# Patient Record
Sex: Female | Born: 1960 | Race: White | Hispanic: Yes | Marital: Married | Smoking: Never smoker
Health system: Southern US, Community
[De-identification: ages and names within clinical notes are randomized; demographics above are authoritative.]

## PROBLEM LIST (undated history)

## (undated) DIAGNOSIS — E113599 Type 2 diabetes mellitus with proliferative diabetic retinopathy without macular edema, unspecified eye: Secondary | ICD-10-CM

## (undated) DIAGNOSIS — Z992 Dependence on renal dialysis: Secondary | ICD-10-CM

## (undated) DIAGNOSIS — I10 Essential (primary) hypertension: Secondary | ICD-10-CM

## (undated) DIAGNOSIS — E7800 Pure hypercholesterolemia, unspecified: Secondary | ICD-10-CM

## (undated) DIAGNOSIS — E11319 Type 2 diabetes mellitus with unspecified diabetic retinopathy without macular edema: Secondary | ICD-10-CM

## (undated) DIAGNOSIS — E1121 Type 2 diabetes mellitus with diabetic nephropathy: Secondary | ICD-10-CM

## (undated) DIAGNOSIS — J189 Pneumonia, unspecified organism: Secondary | ICD-10-CM

## (undated) DIAGNOSIS — H269 Unspecified cataract: Secondary | ICD-10-CM

## (undated) DIAGNOSIS — G43909 Migraine, unspecified, not intractable, without status migrainosus: Secondary | ICD-10-CM

## (undated) DIAGNOSIS — Z9289 Personal history of other medical treatment: Secondary | ICD-10-CM

## (undated) DIAGNOSIS — N186 End stage renal disease: Secondary | ICD-10-CM

## (undated) DIAGNOSIS — G894 Chronic pain syndrome: Secondary | ICD-10-CM

## (undated) DIAGNOSIS — N838 Other noninflammatory disorders of ovary, fallopian tube and broad ligament: Secondary | ICD-10-CM

## (undated) HISTORY — DX: Type 2 diabetes mellitus with diabetic nephropathy: E11.21

## (undated) HISTORY — DX: Other noninflammatory disorders of ovary, fallopian tube and broad ligament: N83.8

## (undated) HISTORY — DX: Chronic pain syndrome: G89.4

## (undated) HISTORY — DX: Essential (primary) hypertension: I10

## (undated) HISTORY — DX: Type 2 diabetes mellitus with proliferative diabetic retinopathy without macular edema, unspecified eye: E11.3599

## (undated) HISTORY — DX: Type 2 diabetes mellitus with unspecified diabetic retinopathy without macular edema: E11.319

## (undated) HISTORY — DX: Unspecified cataract: H26.9

---

## 1984-12-18 HISTORY — PX: TUBAL LIGATION: SHX77

## 2005-10-03 ENCOUNTER — Emergency Department (HOSPITAL_COMMUNITY): Admission: EM | Admit: 2005-10-03 | Discharge: 2005-10-03 | Payer: Self-pay | Admitting: Emergency Medicine

## 2005-10-09 ENCOUNTER — Ambulatory Visit: Payer: Self-pay | Admitting: Internal Medicine

## 2011-09-18 DIAGNOSIS — Z9289 Personal history of other medical treatment: Secondary | ICD-10-CM

## 2011-09-18 HISTORY — DX: Personal history of other medical treatment: Z92.89

## 2011-10-09 ENCOUNTER — Inpatient Hospital Stay (HOSPITAL_COMMUNITY)
Admission: EM | Admit: 2011-10-09 | Discharge: 2011-10-12 | DRG: 754 | Disposition: A | Discharge status: Home / Self Care | Payer: Self-pay | Attending: Internal Medicine | Admitting: Internal Medicine

## 2011-10-09 ENCOUNTER — Emergency Department (HOSPITAL_COMMUNITY): Payer: Self-pay

## 2011-10-09 DIAGNOSIS — E876 Hypokalemia: Secondary | ICD-10-CM | POA: Diagnosis not present

## 2011-10-09 DIAGNOSIS — F3289 Other specified depressive episodes: Secondary | ICD-10-CM | POA: Diagnosis present

## 2011-10-09 DIAGNOSIS — C569 Malignant neoplasm of unspecified ovary: Principal | ICD-10-CM | POA: Diagnosis present

## 2011-10-09 DIAGNOSIS — D630 Anemia in neoplastic disease: Secondary | ICD-10-CM | POA: Diagnosis present

## 2011-10-09 DIAGNOSIS — F329 Major depressive disorder, single episode, unspecified: Secondary | ICD-10-CM | POA: Diagnosis present

## 2011-10-09 DIAGNOSIS — E119 Type 2 diabetes mellitus without complications: Secondary | ICD-10-CM | POA: Diagnosis present

## 2011-10-09 DIAGNOSIS — N179 Acute kidney failure, unspecified: Secondary | ICD-10-CM | POA: Diagnosis present

## 2011-10-09 DIAGNOSIS — R188 Other ascites: Secondary | ICD-10-CM | POA: Diagnosis present

## 2011-10-09 DIAGNOSIS — E43 Unspecified severe protein-calorie malnutrition: Secondary | ICD-10-CM | POA: Diagnosis present

## 2011-10-09 LAB — DIFFERENTIAL
Basophils Absolute: 0 10*3/uL (ref 0.0–0.1)
Basophils Relative: 0 % (ref 0–1)
Eosinophils Absolute: 0 10*3/uL (ref 0.0–0.7)
Eosinophils Relative: 0 % (ref 0–5)
Lymphocytes Relative: 6 % — ABNORMAL LOW (ref 12–46)
Lymphs Abs: 1.2 10*3/uL (ref 0.7–4.0)
Monocytes Absolute: 1.5 10*3/uL — ABNORMAL HIGH (ref 0.1–1.0)
Monocytes Relative: 7 % (ref 3–12)
Neutro Abs: 18.1 10*3/uL — ABNORMAL HIGH (ref 1.7–7.7)
Neutrophils Relative %: 87 % — ABNORMAL HIGH (ref 43–77)
Smear Review: INCREASED

## 2011-10-09 LAB — COMPREHENSIVE METABOLIC PANEL
ALT: 6 U/L (ref 0–35)
AST: 9 U/L (ref 0–37)
Albumin: 3.1 g/dL — ABNORMAL LOW (ref 3.5–5.2)
Alkaline Phosphatase: 102 U/L (ref 39–117)
BUN: 44 mg/dL — ABNORMAL HIGH (ref 6–23)
CO2: 22 mEq/L (ref 19–32)
Calcium: 9.2 mg/dL (ref 8.4–10.5)
Chloride: 103 mEq/L (ref 96–112)
Creatinine, Ser: 2.99 mg/dL — ABNORMAL HIGH (ref 0.50–1.10)
GFR calc Af Amer: 20 mL/min — ABNORMAL LOW (ref >90)
GFR calc non Af Amer: 17 mL/min — ABNORMAL LOW (ref >90)
Glucose, Bld: 214 mg/dL — ABNORMAL HIGH (ref 70–99)
Potassium: 4.3 mEq/L (ref 3.5–5.1)
Sodium: 139 mEq/L (ref 135–145)
Total Bilirubin: 0.5 mg/dL (ref 0.3–1.2)
Total Protein: 7.6 g/dL (ref 6.0–8.3)

## 2011-10-09 LAB — CBC
HCT: 17.4 % — ABNORMAL LOW (ref 36.0–46.0)
Hemoglobin: 4.5 g/dL — CL (ref 12.0–15.0)
MCH: 14.8 pg — ABNORMAL LOW (ref 26.0–34.0)
MCHC: 25.9 g/dL — ABNORMAL LOW (ref 30.0–36.0)
MCV: 57.2 fL — ABNORMAL LOW (ref 78.0–100.0)
Platelets: 462 10*3/uL — ABNORMAL HIGH (ref 150–400)
RBC: 3.04 MIL/uL — ABNORMAL LOW (ref 3.87–5.11)
RDW: 19.1 % — ABNORMAL HIGH (ref 11.5–15.5)
WBC: 20.8 10*3/uL — ABNORMAL HIGH (ref 4.0–10.5)

## 2011-10-09 LAB — PROTIME-INR
INR: 1.26 (ref 0.00–1.49)
Prothrombin Time: 16.1 seconds — ABNORMAL HIGH (ref 11.6–15.2)

## 2011-10-09 LAB — SODIUM, URINE, RANDOM: Sodium, Ur: 28 mEq/L

## 2011-10-09 LAB — AMMONIA: Ammonia: <10 umol/L — ABNORMAL LOW (ref 11–60)

## 2011-10-09 LAB — TSH: TSH: 9.046 u[IU]/mL — ABNORMAL HIGH (ref 0.350–4.500)

## 2011-10-09 LAB — ABO/RH: ABO/RH(D): O POS

## 2011-10-10 ENCOUNTER — Other Ambulatory Visit: Payer: Self-pay | Admitting: Diagnostic Radiology

## 2011-10-10 ENCOUNTER — Inpatient Hospital Stay (HOSPITAL_COMMUNITY): Payer: Self-pay

## 2011-10-10 LAB — GLUCOSE, CAPILLARY
Glucose-Capillary: 144 mg/dL — ABNORMAL HIGH (ref 70–99)
Glucose-Capillary: 159 mg/dL — ABNORMAL HIGH (ref 70–99)

## 2011-10-10 LAB — URINALYSIS, MICROSCOPIC ONLY
Bilirubin Urine: NEGATIVE
Nitrite: POSITIVE — AB
Specific Gravity, Urine: 1.016 (ref 1.005–1.030)
pH: 5.5 (ref 5.0–8.0)

## 2011-10-10 LAB — IRON AND TIBC
Iron: 28 ug/dL — ABNORMAL LOW (ref 42–135)
Saturation Ratios: 10 % — ABNORMAL LOW (ref 20–55)
TIBC: 286 ug/dL (ref 250–470)
UIBC: 258 ug/dL (ref 125–400)

## 2011-10-10 LAB — AMYLASE, BODY FLUID: Amylase, Fluid: 146 U/L

## 2011-10-10 LAB — VITAMIN B12: Vitamin B-12: 538 pg/mL (ref 211–911)

## 2011-10-10 LAB — COMPREHENSIVE METABOLIC PANEL
AST: 13 U/L (ref 0–37)
BUN: 47 mg/dL — ABNORMAL HIGH (ref 6–23)
CO2: 20 mEq/L (ref 19–32)
Calcium: 9.1 mg/dL (ref 8.4–10.5)
Chloride: 102 mEq/L (ref 96–112)
Creatinine, Ser: 2.62 mg/dL — ABNORMAL HIGH (ref 0.50–1.10)
GFR calc Af Amer: 24 mL/min — ABNORMAL LOW (ref >90)
GFR calc non Af Amer: 20 mL/min — ABNORMAL LOW (ref >90)
Glucose, Bld: 152 mg/dL — ABNORMAL HIGH (ref 70–99)
Total Bilirubin: 0.7 mg/dL (ref 0.3–1.2)

## 2011-10-10 LAB — CBC
MCHC: 30.8 g/dL (ref 30.0–36.0)
RDW: 27.1 % — ABNORMAL HIGH (ref 11.5–15.5)

## 2011-10-10 LAB — LACTATE DEHYDROGENASE, PLEURAL OR PERITONEAL FLUID: LD, Fluid: 1272 U/L — ABNORMAL HIGH (ref 3–23)

## 2011-10-10 LAB — BODY FLUID CELL COUNT WITH DIFFERENTIAL
Monocyte-Macrophage-Serous Fluid: 16 % — ABNORMAL LOW (ref 50–90)
Total Nucleated Cell Count, Fluid: 1000 cu mm (ref 0–1000)

## 2011-10-10 LAB — GLUCOSE, SEROUS FLUID: Glucose, Fluid: 139 mg/dL

## 2011-10-10 LAB — CA 125: CA 125: 5863.7 U/mL — ABNORMAL HIGH (ref 0.0–30.2)

## 2011-10-11 LAB — COMPREHENSIVE METABOLIC PANEL
ALT: 7 U/L (ref 0–35)
AST: 11 U/L (ref 0–37)
Albumin: 2.5 g/dL — ABNORMAL LOW (ref 3.5–5.2)
CO2: 24 mEq/L (ref 19–32)
Chloride: 107 mEq/L (ref 96–112)
GFR calc non Af Amer: 29 mL/min — ABNORMAL LOW (ref >90)
Sodium: 144 mEq/L (ref 135–145)
Total Bilirubin: 0.7 mg/dL (ref 0.3–1.2)

## 2011-10-11 LAB — CROSSMATCH
ABO/RH(D): O POS
Antibody Screen: NEGATIVE
Unit division: 0
Unit division: 0
Unit division: 0
Unit division: 0

## 2011-10-11 LAB — CBC
Platelets: 360 10*3/uL (ref 150–400)
RBC: 5.05 MIL/uL (ref 3.87–5.11)
RDW: 26.2 % — ABNORMAL HIGH (ref 11.5–15.5)
WBC: 9.8 10*3/uL (ref 4.0–10.5)

## 2011-10-11 LAB — URINE CULTURE
Colony Count: >=100000
Culture  Setup Time: 201210230612

## 2011-10-11 LAB — GLUCOSE, CAPILLARY
Glucose-Capillary: 95 mg/dL (ref 70–99)
Glucose-Capillary: 100 mg/dL — ABNORMAL HIGH (ref 70–99)

## 2011-10-11 LAB — FOLATE RBC: RBC Folate: 949 ng/mL — ABNORMAL HIGH (ref >=366)

## 2011-10-11 LAB — MAGNESIUM: Magnesium: 2.3 mg/dL (ref 1.5–2.5)

## 2011-10-12 ENCOUNTER — Other Ambulatory Visit (HOSPITAL_COMMUNITY)
Admission: RE | Admit: 2011-10-12 | Discharge: 2011-10-12 | Disposition: A | Discharge status: Home / Self Care | Payer: Self-pay | Source: Ambulatory Visit | Attending: Gynecologic Oncology | Admitting: Gynecologic Oncology

## 2011-10-12 ENCOUNTER — Ambulatory Visit: Payer: Self-pay | Attending: Gynecologic Oncology | Admitting: Gynecologic Oncology

## 2011-10-12 ENCOUNTER — Other Ambulatory Visit: Payer: Self-pay | Admitting: Gynecologic Oncology

## 2011-10-12 DIAGNOSIS — R1909 Other intra-abdominal and pelvic swelling, mass and lump: Secondary | ICD-10-CM | POA: Insufficient documentation

## 2011-10-12 DIAGNOSIS — Z8540 Personal history of malignant neoplasm of unspecified female genital organ: Secondary | ICD-10-CM | POA: Insufficient documentation

## 2011-10-12 DIAGNOSIS — N8500 Endometrial hyperplasia, unspecified: Secondary | ICD-10-CM | POA: Insufficient documentation

## 2011-10-12 DIAGNOSIS — E119 Type 2 diabetes mellitus without complications: Secondary | ICD-10-CM | POA: Insufficient documentation

## 2011-10-12 DIAGNOSIS — R971 Elevated cancer antigen 125 [CA 125]: Secondary | ICD-10-CM | POA: Insufficient documentation

## 2011-10-12 DIAGNOSIS — R188 Other ascites: Secondary | ICD-10-CM | POA: Insufficient documentation

## 2011-10-12 LAB — BASIC METABOLIC PANEL
BUN: 46 mg/dL — ABNORMAL HIGH (ref 6–23)
CO2: 26 mEq/L (ref 19–32)
Chloride: 108 mEq/L (ref 96–112)
Creatinine, Ser: 1.69 mg/dL — ABNORMAL HIGH (ref 0.50–1.10)
GFR calc Af Amer: 40 mL/min — ABNORMAL LOW (ref >90)
Glucose, Bld: 96 mg/dL (ref 70–99)
Potassium: 3.5 mEq/L (ref 3.5–5.1)

## 2011-10-12 LAB — CBC
HCT: 36.7 % (ref 36.0–46.0)
Hemoglobin: 11.7 g/dL — ABNORMAL LOW (ref 12.0–15.0)
MCV: 69.9 fL — ABNORMAL LOW (ref 78.0–100.0)
RBC: 5.25 MIL/uL — ABNORMAL HIGH (ref 3.87–5.11)
WBC: 8.4 10*3/uL (ref 4.0–10.5)

## 2011-10-12 LAB — GLUCOSE, CAPILLARY: Glucose-Capillary: 100 mg/dL — ABNORMAL HIGH (ref 70–99)

## 2011-10-13 ENCOUNTER — Other Ambulatory Visit: Payer: Self-pay | Admitting: Gynecology

## 2011-10-13 ENCOUNTER — Encounter (HOSPITAL_COMMUNITY): Payer: Self-pay | Attending: Obstetrics and Gynecology

## 2011-10-13 DIAGNOSIS — Z01818 Encounter for other preprocedural examination: Secondary | ICD-10-CM | POA: Insufficient documentation

## 2011-10-13 DIAGNOSIS — Z01812 Encounter for preprocedural laboratory examination: Secondary | ICD-10-CM | POA: Insufficient documentation

## 2011-10-13 LAB — COMPREHENSIVE METABOLIC PANEL
ALT: 8 U/L (ref 0–35)
Albumin: 2.9 g/dL — ABNORMAL LOW (ref 3.5–5.2)
Alkaline Phosphatase: 169 U/L — ABNORMAL HIGH (ref 39–117)
Chloride: 102 mEq/L (ref 96–112)
Glucose, Bld: 128 mg/dL — ABNORMAL HIGH (ref 70–99)
Potassium: 3.7 mEq/L (ref 3.5–5.1)
Sodium: 142 mEq/L (ref 135–145)
Total Bilirubin: 0.6 mg/dL (ref 0.3–1.2)
Total Protein: 7.8 g/dL (ref 6.0–8.3)

## 2011-10-13 LAB — DIFFERENTIAL
Basophils Relative: 0 % (ref 0–1)
Eosinophils Absolute: 0.5 10*3/uL (ref 0.0–0.7)
Eosinophils Relative: 5 % (ref 0–5)
Lymphs Abs: 1.3 10*3/uL (ref 0.7–4.0)
Monocytes Absolute: 0.7 10*3/uL (ref 0.1–1.0)

## 2011-10-13 LAB — CBC
HCT: 36.6 % (ref 36.0–46.0)
MCH: 22.1 pg — ABNORMAL LOW (ref 26.0–34.0)
MCHC: 30.6 g/dL (ref 30.0–36.0)
MCV: 72.3 fL — ABNORMAL LOW (ref 78.0–100.0)

## 2011-10-13 LAB — BODY FLUID CULTURE

## 2011-10-13 LAB — SURGICAL PCR SCREEN: MRSA, PCR: NEGATIVE

## 2011-10-13 NOTE — Consult Note (Signed)
[name], [name]           ACCOUNT NO.:  [id number]  MEDICAL RECORD NO.:  [id number]  LOCATION:  GYN                          FACILITY:  [facility]  PHYSICIAN:  Laurette Schimke, MD     DATE OF BIRTH:  [birth year]  DATE OF CONSULTATION:10/12/2011 DATE OF DISCHARGE:                                CONSULTATION   REASON FOR CONSULT:  Consult was requested on October 12, 2011, for assistance in evaluation and management of large volume ascites and a pelvic mass.  HISTORY OF PRESENT ILLNESS:  This is a 50 year old, gravida 4, para 4 last normal menstrual period 3 weeks ago.  The patient's history is notable for irregular menses.  She states that they are spaced out.  She reports heavy bleeding for the 1st 2 days of menses.  She states that her periods have been regular since her last pregnancy in 1986.  She presented to the emergency room on October 09, 2011, with complaints of swelling and tenderness of the abdomen for the last 6 months.  On initial evaluation in the emergency room, she was noted to have a hemoglobin of 4.5.  During the hospitalization, she received 4 units of packed red blood cells.  A CT scan of the abdomen and pelvis was collected and was notable for a 27.5-cm cystic mass in the lower abdomen/pelvis with enhancing septation and several calcifications. There is a large amount of associated ascites and anasarca.  There is also small pericardial effusion.  There is no evidence of retroperitoneal or pelvic adenopathy.  Chest x-ray was notable for suboptimal inspiration.  A CA-125 was obtained, returned a value of 5863.  The patient denies any history of alcohol use or abuse.  She denies tuberculosis or TB exposure.  She states that her appetite is normal.  She reports 30-pound weight gain over the last few months.  She denies any rectal bleeding.  She does report constipation and denies diarrhea.  There is no nausea or vomiting.  PAST MEDICAL HISTORY:  Type 2  diabetes mellitus diagnosed 26 years ago, treated with diet.  PAST SURGICAL HISTORY:  Bilateral tubal ligation in 1986.  PAST GYNECOLOGIC HISTORY:  Gravida 4, para 4, 4 normal spontaneous vaginal deliveries.  Menarche occurred at age of 89 with irregular menses.  She denies any history of abnormal Pap test. Her Last Pap test was in 1986.  MEDICATIONS: 1. Phenergan p.r.n. nausea. 2. Tylenol p.r.n. pain.  ALLERGIES:  No known drug allergies.  SOCIAL HISTORY:  The patient is married.  She is from [place] and immigrated to [place] in 1999.  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  She lives with her husband and 1 daughter who is married with children.  REVIEW OF SYSTEMS:  Fatigue, 30-pound weight gain, shortness of breath with exertion, 3-pillow orthopnea.  Denies nocturia.  There is no chest pain.  She reports intermittent edema bilateral lower extremities.  She reports abdominal distention.  Denies vomiting.  Denies bloody stools. No history of colonoscopy.  No diarrhea or intermittent constipation. Denies dysuria or vaginal discharge.  States that her menses are irregular, her menses last approximately 3 days, but  can be heavy. Otherwise, 10-point review of systems is negative.  PHYSICAL EXAMINATION:  GENERAL:  Well-developed very thin cachectic- appearing female in no acute distress. VITAL SIGNS: Weight 147 pounds, height 4 feet 11 inches, blood pressure 134/70, pulse 86. CHEST:  Clear to auscultation. HEART:  Decreased breath sounds over the lower 1/3 of bilateral lung fields. ABDOMEN:  Markedly distended consistent with ascites.  No palpable omental cake. BACK:  No CVA tenderness. PELVIC EXAMINATION:  Normal external genitalia, Bartholin's, urethra, and Skene's.  Cervix is approximately 2 cm without any lesions.  Pap test was collected and endometrial biopsy was obtained.  Uterus sounded to 7 cm.  The pelvic mass was  smooth, no nodularity noted in the cul-de- sac. RECTAL EXAMINATION:  Good anal sphincter tone without any cul-de-sac, nodularity.  LABORATORY DATA:  Paracentesis negative.  Hemoglobin 11.7, hematocrit 36.  BUN and creatinine on October 11, 2011, 1.94, albumin 2.5, hemoglobin A1c 6.7, CA-125 5863.  IMPRESSION:  This is a 50 year old with a large pelvic mass, large volume ascites, and a markedly elevated CA-125.  CT scan is notable for the presence of a septated mass; however, no retroperitoneal adenopathy is noted.  No peritoneal carcinomatosis is appreciated.  Large volume ascites with pleural effusions is noted.  The paracentesis was negative for any evidence of malignancy.  The patient denies history of rectal bleeding or from prior colonoscopy differential diagnosis includes ovarian neoplasm malignancy.  This may be reflective of Meige syndrome or metastatic disease.  An endometrial biopsy was collected today, but it is unclear that the significant anemia was caused by abnormal uterine bleeding.  The recommendation was for exploratory laparotomy with removal of the mass with possible hysterectomy, bilateral salpingo- oophorectomy, debulking or other staging procedures as indicated.  The patient is aware that her poor nutritional status may result in a longer than expected postoperative course.  Through the use of an interpreter, she understands the risks of the procedure, infection, bleeding, damage to surrounding structures, prolonged hospitalization, and reoperation. This procedure will occur on October 17, 2011.  The patient is to have this done by Dr. De Blanch, who will be her surgeon.  All of her questions and those of her husband were answered through an interpreter to their satisfaction.     Laurette Schimke, MD     WB/MEDQ  D:  10/12/2011  T:  10/12/2011  Job:  [id number]  cc:   Philip Aspen, MD       Fax [phone]  Telford Nab, R.N. [address] [place], [place] [address]  Electronically Signed by Laurette Schimke MD on 10/13/2011 10:26:23 AM

## 2011-10-15 LAB — CULTURE, BLOOD (ROUTINE X 2)
Culture  Setup Time: 201210222301
Culture  Setup Time: 201210222301
Culture: NO GROWTH
Culture: NO GROWTH

## 2011-10-15 NOTE — H&P (Signed)
NAMESHONNIE, POUDRIER NO.:  [id number]  MEDICAL RECORD NO.:  [id number]  LOCATION:  [facility]                         FACILITY:  [facility]  PHYSICIAN:  Jonny Ruiz, MD    DATE OF BIRTH:  Jul 21, 1961  DATE OF ADMISSION:  10/09/2011 DATE OF DISCHARGE:                             HISTORY & PHYSICAL   PRIMARY CARE PHYSICIAN:  Unassigned.  CHIEF COMPLAINT:  Shortness of breath.  HISTORY OF PRESENT ILLNESS:  The patient is a 50 year old female, G80 P4, from [place] living in the [place] since 1999, with no prior medical care except for her last vaginal delivery, presenting to the emergency department because of shortness of breath.  The patient has been developing swelling and tenderness of the abdomen for the last 6 months.  Upon further questioning, she says that she has been having intermittent mild-to-moderate generalized abdominal pain for the last 4- 5 years after she underwent a knee surgery for an accident about 4-5 years ago.  Over the last 6 months, she has noticed that her abdomen has been getting bigger to the point that she was not able to lie flat or speak in full sentences.  Today the patient was evaluated in the ED with a CT scan of the abdomen in addition to her blood work, and has been found with a large ovarian mass associated to ascites/anasarca, and a hemoglobin of 4.5.  Hospitalist was contacted for further evaluation and management.  The patient denies a history of alcohol abuse, drug abuse, or hepatitis.  She denies tuberculosis or TB exposure.  She tells me her last daughter developed hepatitis at age 74.  The patient has never had a Pap smear or a mammogram.  She has never had a colonoscopy.  She is not sure if she received BCG vaccination in her country.  The patient is always able to recall that she was told she had diabetes, but she did not need medications.  PAST MEDICAL HISTORY:  Diabetes mellitus type 2 diagnosed 26 years ago and  told that she only needed to be on a diet.  PAST SURGICAL HISTORY:  Bilateral tubal ligation 1986.  G4 P4.  MEDICATIONS:  None, but she does take aspirin and Aleve on a p.r.n. basis for abdominal pain.  ALLERGIES:  NO KNOWN DRUG ALLERGIES.  FAMILY HISTORY:  Mother died in her 73s from a heart attack.  Father, she never knew.  Her brothers and sisters, she is not aware of their health conditions.  Grandparents, not aware of health conditions.  Again 1 daughter developed hepatitis of an unknown type at the age of 2.  SOCIAL HISTORY:  The patient is originally from [address], [place], and came to the [place] in 1999.  She used to work in [employer] of [employer], but not anymore.  Currently she lives with her husband, and she has 1 daughter who is married with children who lives with them. She never smoked, drank, or used drugs.  REVIEW OF SYSTEMS:  CONSTITUTIONAL:  Positive for malaise, fatigue, and weight gain.  Also positive for chills without fever.  No night sweats. CARDIOVASCULAR:  Positive for shortness of breath, orthopnea, and nocturia.  No palpitations  or chest pain.  Also positive for edema of the lower extremities bilaterally.  RESPIRATORY:  Positive for shortness of breath with minimal exertion or in the recumbent position.  No cough or wheezes.  Denies tuberculosis or TB exposure, but she is not sure what tuberculosis is.  GASTROINTESTINAL:  Positive for abdominal pain, generalized, moderate, radiating to her back, lately not relieved by aspirin or Aleve.  Not relieved by eating.  Denies nausea or vomiting. Notices dark stools.  Denies blood.  No diarrhea or constipation.  GU: Denies dysuria, frequency, or hematuria.  No vaginal discharge. Positive for irregular menstrual cycles for the last 4 years associated to heavy menstrual bleeding, the last time 4 days ago.  MUSCULOSKELETAL: No arthralgias or myalgias.  NEUROLOGIC:  No headaches, neck  stiffness, focal weakness, numbness, or paresthesias.  PHYSICAL EXAMINATION:  VITAL SIGNS:  Blood pressure 152/75, pulse 103, respirations 24, temperature 97.8, and O2 saturation 100% on oxygen 2 L per nasal cannula. GENERAL APPEARANCE:  The patient is a chronically ill-looking female who appears pale in no acute distress. HEENT:  Significant for pallor of the skin and mucosa.  Oral cavity, several missing teeth.  Tongue midline oropharynx clear. NECK:  Supple.  No lymphadenopathy.  There is 1+ JVD. HEART:  There is a systolic ejection murmur 2/6 over the precordium, more pronounced over the apex.  There is an S3 gallop.  No rubs. LUNGS:  Diminished breath sounds at the bases with bilateral dry rales. ABDOMEN:  Quite enlarged with the appearance of 9 months pregnancy.  She has vein redistribution as well as tense ascites.  It is tender diffusely.  No guarding or rebounding.  Unable to palpate organomegaly or masses. EXTREMITIES:  She has 3+ pitting edema bilaterally. NEUROLOGIC:  Nonfocal.  LABORATORY DATA:  WBC 20.8, hemoglobin 4.5, hematocrit 17.4, MCV 57.2, platelet count 462, and neutrophils 87%.  She has RBC erythrocytes. Ammonia less than 10.  Sodium 139, potassium 4.3, chloride 103, carbon dioxide 22, glucose 214, BUN 44, creatinine 2.99, calcium 9.2, total protein 7.6, albumin 3.1, AST 9, ALT 6, alk phos 102, and total bili 0.5.  GFR estimated 17.  PT 16.1, normal.  Chest x-ray is markedly suboptimal inspiration due to body habitus accounts for atelectasis in the lower lobes.  No acute cardiopulmonary disease.  Cardiomegaly.  CT abdomen and pelvis without contrast reveals a cystic mass in the lower abdomen and pelvis with enhancing septations and several calcifications. This measures at least 27.5 cm and is likely an ovarian mass.  There is a large amount of associated ascites and anasarca.  There is also a small pericardial effusion.  MEDICAL DECISION MAKING:  This is a  50 year old female originally from [place] with no prior medical care, with a history of diabetes on no treatment, presenting with shortness of breath and found with, 1. Ascites/anasarca. 2. Anemia with a hemoglobin of 4.5. 3. Leukocytosis with a total white count of 20.8, and of a neutrophil     predominance. 4. Renal insufficiency with an estimated creatinine clearance of 17. 5. A large ovarian mass. 6. Systolic hypertension. 7. History of diabetes mellitus type 2. 8. Small pericardial effusion. 9. Irregular menstrual cycle with menorrhagia.  PLAN: 1. Admit the patient to the hospital.  Respiratory isolation, rule out     tuberculosis. 2. Type and cross, and transfuse 4 units of packed RBCs; 2 today and 2     tomorrow. 3. Consult Interventional Radiology for abdominal paracenteses, and     once  it is performed, obtain ascitic fluid analysis.  Again, rule     out tuberculosis, and rule out malignant.  I suspect this is a case     of malignancy. 4. Obtain blood cultures x2. 5. Obtain renal insufficiency workup including UA, urine sodium, urine     creatinine, and make appropriate calculations. 6. Obtain hemoglobin A1c and TSH. 7. Place the patient on insulin at sliding scale. 8. If the blood pressure becomes a problem, we will start the patient     on hydralazine. 9. Obtain GYN consultation. 10.Interferon TB Gold Test. 11.Repeat CBC and B-met in the morning. 12.Obtain 2D echocardiogram to better assess her pericardial effusion. 13.Obtain iron, IBC, ferritin, B12, and RBC folate.          ______________________________ Jonny Ruiz, MD     GL/MEDQ  D:  10/09/2011  T:  10/09/2011  Job:  [id number]  Electronically Signed by Jonny Ruiz MD on 10/15/2011 09:59:51 PM

## 2011-10-17 ENCOUNTER — Inpatient Hospital Stay (HOSPITAL_COMMUNITY)
Admission: RE | Admit: 2011-10-17 | Discharge: 2011-10-20 | DRG: 742 | Disposition: A | Discharge status: Home / Self Care | Payer: Self-pay | Source: Ambulatory Visit | Attending: Obstetrics & Gynecology | Admitting: Obstetrics & Gynecology

## 2011-10-17 ENCOUNTER — Other Ambulatory Visit: Payer: Self-pay | Admitting: Gynecology

## 2011-10-17 DIAGNOSIS — N838 Other noninflammatory disorders of ovary, fallopian tube and broad ligament: Secondary | ICD-10-CM

## 2011-10-17 DIAGNOSIS — N80109 Endometriosis of ovary, unspecified side, unspecified depth: Principal | ICD-10-CM | POA: Diagnosis present

## 2011-10-17 DIAGNOSIS — E119 Type 2 diabetes mellitus without complications: Secondary | ICD-10-CM | POA: Diagnosis present

## 2011-10-17 DIAGNOSIS — N801 Endometriosis of ovary: Principal | ICD-10-CM | POA: Diagnosis present

## 2011-10-17 DIAGNOSIS — Z01812 Encounter for preprocedural laboratory examination: Secondary | ICD-10-CM

## 2011-10-17 DIAGNOSIS — R188 Other ascites: Secondary | ICD-10-CM | POA: Diagnosis present

## 2011-10-17 DIAGNOSIS — D649 Anemia, unspecified: Secondary | ICD-10-CM | POA: Diagnosis present

## 2011-10-17 DIAGNOSIS — Z01810 Encounter for preprocedural cardiovascular examination: Secondary | ICD-10-CM

## 2011-10-17 DIAGNOSIS — R971 Elevated cancer antigen 125 [CA 125]: Secondary | ICD-10-CM | POA: Diagnosis present

## 2011-10-17 HISTORY — DX: Other noninflammatory disorders of ovary, fallopian tube and broad ligament: N83.8

## 2011-10-17 HISTORY — PX: EXPLORATORY LAPAROTOMY: SUR591

## 2011-10-17 HISTORY — PX: ABDOMINAL HYSTERECTOMY: SHX81

## 2011-10-17 LAB — GLUCOSE, CAPILLARY: Glucose-Capillary: 134 mg/dL — ABNORMAL HIGH (ref 70–99)

## 2011-10-17 LAB — TYPE AND SCREEN
ABO/RH(D): O POS
Antibody Screen: NEGATIVE

## 2011-10-17 LAB — ABO/RH: ABO/RH(D): O POS

## 2011-10-17 NOTE — Discharge Summary (Signed)
[name], [name]           ACCOUNT NO.:  [id number]  MEDICAL RECORD NO.:  [id number]  LOCATION:  4507                         FACILITY:  [facility]  PHYSICIAN:  Peggye Pitt, M.D. DATE OF BIRTH:  13-Aug-1961  DATE OF ADMISSION:  10/09/2011 DATE OF DISCHARGE:  10/12/2011                              DISCHARGE SUMMARY   DISCHARGE DIAGNOSES: 1. 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 2. Acute renal failure, improving. 3. Anemia of chronic disease. 4. Hypokalemia, repleted.  DISCHARGE MEDICATIONS: 1. Phenergan 25 mg every 8 hours as needed for nausea. 2. Tylenol 650 mg every 4 hours as needed for pain or fever.  DISPOSITION AND FOLLOWUP:  [name] will be discharged today in stable condition.  She has an appointment today at 1:00 p.m. at the [facility] with Dr. Laurette Schimke who is the GYN oncologist.  She will likely need further staging and treatment for this problem.  CONSULTATIONS THIS HOSPITALIZATION:  None.IMAGES AND PROCEDURES: 1. A chest x-ray on October 22 that showed markedly suboptimal     inspiration with no acute cardiopulmonary disease and cardiomegaly. 2. A CT scan of the abdomen and pelvis without contrast on October 22     that showed bibasilar atelectasis.  Massive ascites is present.     There is a cystic mass in the lower abdomen/pelvis.  There are     several enhancing septations and calcifications areas.  Anasarca is     present.  The cystic mass in the lower abdominal/pelvic area     measures 27.5 cm and is likely an ovarian neoplasm.  There is a     large amount of associated ascites and anasarca.  The patient also     had an ultrasound-guided paracentesis on October 23 during which a     4 L of acetic fluid were removed.  HISTORY AND PHYSICAL:  For full details, please refer to dictation on October 22 by Dr. Jordan Hawks, however, in brief, Ms. Shawnie Dapper is a 50 year old Hispanic lady originally from [place] who has been living in the [place] since  1999, who has not had any prior medical care except for her last vaginal delivery, who presented to the emergency department secondary to shortness of breath.  She stated that she had been developing swelling and tenderness of the abdomen for approximately 6 weeks.  In the emergency department, she had a CT scan with findings of probable ovarian neoplasm and we are asked to admit her for further evaluation.  HOSPITAL COURSE BY PROBLEM: 1. Probable ovarian neoplasm.  She was sent for a paracentesis.     Paracentesis fluid was sent for cytology which has not shown any     malignant cells.  However, this is highly suspicious for an ovarian     neoplasm.  I have called a GYN Oncology and their nurse Harriett Sine has     spoken with me today.  The plan is for her to go to [facility] today at 1:00 p.m. and she will have an appointment     with Dr. Laurette Schimke.  Following this, she will likely have     surgery sometime next week.  Social work has  been involved in her     care, and they are assisting with financial counseling as well. 2. Acute renal failure.  On admission, her creatinine was found to be     2.62.  This is likely prerenal in nature and has decreased to 1.69     on day of discharge.  The patient has been told to drink copious     amount of fluids and they should resolve on its own. 3. Hypokalemia, her potassium was 3.2 on day prior to discharge.  This     is being repleted successfully. 4. Anemia of chronic disease.  On admission, she had a hemoglobin of     4.5, and she was transfused of 4 units and her hemoglobin has been     stable at 11.  VITALS ON DAY OF DISCHARGE:  Blood pressure 132/76, heart rate 97, respirations 20, temp of 99.2, sats of 98% on room air.     Peggye Pitt, M.D.     EH/MEDQ  D:  10/12/2011  T:  10/12/2011  Job:  [id number]  Electronically Signed by Peggye Pitt M.D. on 10/17/2011 06:22:37 PM

## 2011-10-18 LAB — CBC
HCT: 33.9 % — ABNORMAL LOW (ref 36.0–46.0)
Hemoglobin: 10.4 g/dL — ABNORMAL LOW (ref 12.0–15.0)
MCHC: 29.5 g/dL — ABNORMAL LOW (ref 30.0–36.0)
MCV: 73.4 fL — ABNORMAL LOW (ref 78.0–100.0)
RBC: 4.78 MIL/uL (ref 3.87–5.11)
WBC: 17.1 10*3/uL — ABNORMAL HIGH (ref 4.0–10.5)

## 2011-10-18 LAB — BASIC METABOLIC PANEL
BUN: 21 mg/dL (ref 6–23)
CO2: 23 mEq/L (ref 19–32)
Calcium: 8 mg/dL — ABNORMAL LOW (ref 8.4–10.5)
Chloride: 106 mEq/L (ref 96–112)
Creatinine, Ser: 1.28 mg/dL — ABNORMAL HIGH (ref 0.50–1.10)
GFR calc Af Amer: 56 mL/min — ABNORMAL LOW (ref >90)
GFR calc non Af Amer: 51 mL/min — ABNORMAL LOW (ref >90)
GFR calc non Af Amer: 48 mL/min — ABNORMAL LOW (ref >90)
Glucose, Bld: 236 mg/dL — ABNORMAL HIGH (ref 70–99)
Potassium: 4.3 mEq/L (ref 3.5–5.1)
Potassium: 4.8 mEq/L (ref 3.5–5.1)
Sodium: 136 mEq/L (ref 135–145)

## 2011-10-18 LAB — URINALYSIS, ROUTINE W REFLEX MICROSCOPIC
Glucose, UA: 100 mg/dL — AB
Specific Gravity, Urine: 1.02 (ref 1.005–1.030)
Urobilinogen, UA: 0.2 mg/dL (ref 0.0–1.0)

## 2011-10-18 LAB — URINE MICROSCOPIC-ADD ON

## 2011-10-19 LAB — GLUCOSE, CAPILLARY
Glucose-Capillary: 130 mg/dL — ABNORMAL HIGH (ref 70–99)
Glucose-Capillary: 99 mg/dL (ref 70–99)
Glucose-Capillary: 96 mg/dL (ref 70–99)

## 2011-10-20 NOTE — Op Note (Signed)
[name], [name]           ACCOUNT NO.:  [id number]  MEDICAL RECORD NO.:  [id number]  LOCATION:  1537                         FACILITY:  [facility]  PHYSICIAN:  De Blanch, M.D.DATE OF BIRTH:  09/07/1961  DATE OF PROCEDURE:  10/17/2011 DATE OF DISCHARGE:                              OPERATIVE REPORT   PREOPERATIVE DIAGNOSIS:  Large abdominopelvic mass with ascites and elevated CA-125, rule out ovarian cancer.  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  PROCEDURE:  Exploratory laparotomy, drainage of 12 L of ascites, left salpingo-oophorectomy, total abdominal hysterectomy, right salpingo- oophorectomy, proctoscopy.  SURGICAL FINDINGS:  At the time of exploratory laparotomy, the patient had approximately 12 L of dark brown ascites.  A sample was sent to Cytopathology.  The left ovary was replaced by a 27 cm tumor, which wascystic and contained several additional liters of old blood fluid. Throughout the peritoneal cavity, there were deposits of hemosiderin- laden macrophages.  The omentum was adherent to the large ovarian tumor. Otherwise, it was adherent to the sigmoid colon.  The right tube and ovary and uterus appeared normal.  Exploration of the upper abdomen revealed adhesions between the liver and gallbladder and the omentum was also adherent to the anterior abdominal wall, high in the upper abdomen. The small bowel had filmy adhesions throughout, which were lysed.  PROCEDURE IN DETAIL:  The patient was brought to the operating room and after satisfactory attainment of general anesthesia, was placed in a modified lithotomy position in [place] stirrups.  The anterior abdominal wall, perineum, and vagina were prepped.  Foley catheter was inserted and the patient was draped.  The abdomen was entered through midline incision, 12 L of ascites were aspirated.  The incision was  extended. The mass was explored and found to be adherent to the sigmoid colon, but otherwise free of any other adhesions, although the omentum was also adherent in 1 area.  The omentum was divided between Bergholz clamps in order to mobilize the mass further.  The mass was then lifted out of the abdominal incision.  Adhesions of the mass to the sigmoid colon were lysed.  The uterus was grasped with long Kelly clamps at the cornua. The left tube and ovary and ovarian ligament were crossclamped, divided, and the left ovarian mass submitted to Pathology.  Frozen section returned showing this to be an endometrioma.  We proceeded to perform hysterectomy.  The right round ligament was divided and the retroperitoneal space opened identifying the ureter. The left ovarian vessels were skeletonized, clamped, cut, free tied, and suture ligated.  The bladder flap was advanced with sharp and blunt dissection.  The uterine vessels were skeletonized, then clamped, cut, and suture ligated.  In a stepwise fashion, the paracervical and cardinal ligaments were clamped, cut, and suture ligated.  The vaginal angles were encountered and these were crossclamped and the vagina transected from its connection to the cervix.  The vagina was then closed with interrupted figure-of-eight sutures of 0-Vicryl.  The pelvis was inspected and found to be hemostatic.  In order to be certain, there was no colonic injury, proctoscopy was performed.  Air was insufflated into the  colon and there was no leak of air noted on performing the "bubble test."  Attention was turned to the upper abdomen, the omentum was lysed from some of the anterior abdominal wall.  The small bowel was then run from the cecum to the ligament of Treitz.  The appendix appeared normal. Adhesions between loops of small bowel were all lysed.  The abdomen and pelvis were irrigated with copious saline in order to extract as much hemosiderin-laden macrophages  as possible.  Retractors removed and the anterior abdominal wall was closed in layers, the 1st being a running mass closure using #1 PDS.  Subcutaneous tissue was irrigated.  Hemostasis was achieved with cautery.  The skin was closed with skin staples.  A dressing was applied.  The patient was awakened from anesthesia and taken to the recovery room in satisfactory condition.  Sponge, needle, instrument counts were correct x2.     De Blanch, M.D.     DC/MEDQ  D:  10/17/2011  T:  10/17/2011  Job:  [id number]  cc:   Roseanna Rainbow, M.D. Fax: [phone]  Telford Nab, R.N. [address] [place], [place] [address]  Philip Aspen, DO Fax: [phone]  Electronically Signed by De Blanch M.D. on 10/20/2011 09:46:07 AM

## 2011-10-25 NOTE — Discharge Summary (Signed)
[name], DUNSWORTH           ACCOUNT NO.:  [id number]  MEDICAL RECORD NO.:  [id number]  LOCATION:  1537                         FACILITY:  [facility]  PHYSICIAN:  Roseanna Rainbow, M.D.DATE OF BIRTH:  01/20/1961  DATE OF ADMISSION:  10/17/2011 DATE OF DISCHARGE:  10/20/2011                              DISCHARGE SUMMARY   CHIEF COMPLAINT:  The patient is a 50 year old with a large abdominal pelvic mass and ascites, elevated CA-125 who presents for operative management.  Please see the dictated history and physical for details.  HOSPITAL COURSE:  The patient was admitted and underwent an exploratory laparotomy, drainage of 12 L of ascites, left salpingo-oophorectomy, total abdominal hysterectomy, right salpingo-oophorectomy, and proctoscopy.  Again, please see the dictated operative summary for findings.  In the PACU, there was decreased urine output and the patient was bolused with Albumisol.  During the evening, the urine output dropped off began and the patient was bolused with a liter of crystalloid.  The low urine output persisted secondary to third spacing and decreased intravascular volume.  On postoperative day #1, a hemoglobin was 10.4 and serum glucose was 236.  The patient had a history of diet-controlled adult onset diabetes prior to admission.  The patient was re-bolused with Albumisol and her IV fluids were increased.  Her urine output improved.  The Foley catheter was discontinued.  A hemoglobin A1c was 6.  On postoperative day #2, there was noted to be a fluid wave on abdominal exam, consistent with the re-accumulation of the preoperative ascites.  The patient's CBGs were in the 170 to 200 range.  She was covered with sliding scale insulin. Her diet was advanced to a CHO-modified regular diet.  Her CBGs normalized.  She was tolerating a regular diet on the day of discharge.  DISCHARGE DIAGNOSES:  Endometrioma of the right ovary,  Pseudo-Meigs syndrome.  PROCEDURE:  Exploratory laparotomy, drainage of 12 L of ascites, left salpingo-oophorectomy, total abdominal hysterectomy, right salpingo- oophorectomy, proctoscopy.  CONDITION:  Stable.  DIET:  CHO modified.  ACTIVITY:  Pelvic rest progressive activity.  MEDICATIONS:  Percocet 5/325 mg 1-2 tablets every 6 hours as needed.  DISPOSITION:  The patient was to follow up in the GYN Oncology office.     Roseanna Rainbow, M.D.     LAJ/MEDQ  D:  10/25/2011  T:  10/25/2011  Job:  [id number]  cc:   Telford Nab, R.N. [address] [place], [place] [address]  Philip Aspen, DO Fax: [phone]

## 2011-10-26 ENCOUNTER — Encounter: Payer: Self-pay | Admitting: *Deleted

## 2011-10-26 NOTE — Progress Notes (Signed)
Pt in for staple removal.  Doing well at home: eating and drinking, pain well controlled, bladder and bowels working without difficulty. Incision clean, dry and intact. Staples removed and Steri-strips applied.  Reportable signs and symptoms reviewed. Return for follow-up as scheduled.

## 2011-11-22 LAB — AFB CULTURE WITH SMEAR (NOT AT ARMC)

## 2011-11-29 ENCOUNTER — Ambulatory Visit: Payer: Self-pay | Attending: Gynecologic Oncology | Admitting: Gynecologic Oncology

## 2011-11-29 ENCOUNTER — Encounter: Payer: Self-pay | Admitting: Gynecologic Oncology

## 2011-11-29 VITALS — BP 148/64 | HR 76 | Temp 98.5°F | Resp 16 | Ht 59.0 in | Wt 112.4 lb

## 2011-11-29 DIAGNOSIS — H5440 Blindness, one eye, unspecified eye: Secondary | ICD-10-CM | POA: Insufficient documentation

## 2011-11-29 DIAGNOSIS — K5900 Constipation, unspecified: Secondary | ICD-10-CM | POA: Insufficient documentation

## 2011-11-29 DIAGNOSIS — N838 Other noninflammatory disorders of ovary, fallopian tube and broad ligament: Secondary | ICD-10-CM | POA: Insufficient documentation

## 2011-11-29 DIAGNOSIS — R188 Other ascites: Secondary | ICD-10-CM | POA: Insufficient documentation

## 2011-11-29 DIAGNOSIS — E119 Type 2 diabetes mellitus without complications: Secondary | ICD-10-CM | POA: Insufficient documentation

## 2011-11-29 DIAGNOSIS — Z9079 Acquired absence of other genital organ(s): Secondary | ICD-10-CM | POA: Insufficient documentation

## 2011-11-29 DIAGNOSIS — Z90710 Acquired absence of both cervix and uterus: Secondary | ICD-10-CM | POA: Insufficient documentation

## 2011-11-29 DIAGNOSIS — Z09 Encounter for follow-up examination after completed treatment for conditions other than malignant neoplasm: Secondary | ICD-10-CM | POA: Insufficient documentation

## 2011-11-29 DIAGNOSIS — I10 Essential (primary) hypertension: Secondary | ICD-10-CM | POA: Insufficient documentation

## 2011-11-29 NOTE — Progress Notes (Signed)
Consult Note: Gyn-Onc  [name] 50 y.o. female  CC:  Chief Complaint  Patient presents with  . Follow-up    pelvic mass    HPI: This is a 50 year old gravida 4 para 4 with a long history of irregular menses. She presented to the emergency room in October 2012 with complaints of swelling and tenderness in the abdomen for 6 months. An initial evaluation in the emergency room she was noted to have a hemoglobin of 4.5. CT scan of the abdomen and pelvis was notable for 27.5 cm cystic mass in the lower abdomen and pelvis with enhancing septations and several calcifications. There was a large amount of associated ascites and anasarca. CA 125 was obtained and was 5863. She had a 30 pound weight and over the past several months. She was taken to the operating room on October 30. Surgical findings included liters of dark brown ascites. The left ovary was replaced by 27 cm tumor which was cystic and contained several additional liters of old blood. The peritoneal cavity due to positive hemosiderin laden macro flashes. The omentum was adherent to the large ovarian tumor. Pathology returned as a large endometrioma. There was no atypia or malignancy. Endometrium revealed disordered proliferative endometrium with no atypia she comes in today for a postoperative check.  Interval History: She is recovering well. She's noticed a little bit of reaccumulation of fluid. She's been measuring her abdominal girth at the request of Harriett Sine and it is typically 37-38 cm. She was trying to eat a little bit lighter so her sugars do not increase too much. She states she has an appointment to the clinic in [place] on Monday for treatment of her diabetes. She's also hopeful that we'll be able to address her right-sided eye blindness and diminished vision on her left side. She is having some issues with her bowels. She continues to complain of some constipation. She has a bowel movement every 3 days. She denies any  vaginal bleeding. She does have some abdominal discomfort she takes a deep breath. She's not been taking any pain medicine.  She does not know the name of physician. We provided her with the record so she can take them with her to that visit.  Review of Systems  Current Meds:  No outpatient encounter prescriptions on file as of 11/29/2011.    Allergy: No Known Allergies  Social Hx:   History   Social History  . Marital Status: Single    Spouse Name: N/A    Number of Children: N/A  . Years of Education: N/A   Occupational History  . Not on file.   Social History Main Topics  . Smoking status: Never Smoker   . Smokeless tobacco: Not on file  . Alcohol Use: No  . Drug Use: No  . Sexually Active: Not Currently   Other Topics Concern  . Not on file   Social History Narrative  . No narrative on file    Past Surgical Hx:  Past Surgical History  Procedure Date  . Tubal ligation 1986  . Exploratory laparotomy 2012    12 L of ascites drainage  . Abdominal hysterectomy 10/17/2011    BSO    Past Medical Hx:  Past Medical History  Diagnosis Date  . Diabetes mellitus   . Ovarian mass   . Ascites   . Hypertension   . Constipation     Family Hx:  Family History  Problem Relation Age of Onset  .  Heart attack Mother     Vitals:  Blood pressure 148/64, pulse 76, temperature 98.5 F (36.9 C), resp. rate 16, height 4\' 11"  (1.499 m), weight 112 lb 6.4 oz (50.984 kg).  Physical Exam:  Well-healed vertical midline incision. There is a positive fluid wave. There is no significant rebound or guarding. There is no tenderness. External genitalia is within normal limits. Vagina slightly atrophic. Vaginal cuff is visualized. Bimanual examination reveals no masse,s nodularity, fluctuance or tenderness. The vaginal cuff is intact. Assessment/Plan: 50 year old status post hysterectomy and bilateral salpingo-oophorectomy on October 30 for a large endometrioma. I discussed with her  and her daughter that the ascites can at times somewhat reaccumulate until the old endometriotic implant stopped producing fluid. I also discussed with them that sometimes there are other conditions that can be associated with the ascites including autoimmune disorders including lupus. They'll continue monitoring her ascites and if it does not decrease significantly over the next 2 months or so she was encouraged to call us. We again gave them copies of our business card that they would feel to contact us. I also encouraged her to make sure that she is compliant with her appointment this coming Monday to be seen by primary care physician and establish yourself her care. Reassured that they have been offered resources here in [place]. They stated they have been given access to those resources and they're seeking to go to [facility]. At this point we'll not schedule followup examination will be happy to see her when necessary. She was given a copy of her operative note as well as her pathology records or she could take them with her to her physician's visit in [place] on Monday. Thank you  ZOXWRU,EAVWU A., MD 11/29/2011, 1:19 PM

## 2011-11-29 NOTE — Patient Instructions (Signed)
Followup with a new primary care physician in [place].

## 2011-11-29 NOTE — Progress Notes (Signed)
I, Kharon Hixon,JULIE was present for the history and physical exam of patient [name]. I interpreted for the provider and for the family and patient.   Gladis Soley,JULIE 11/29/2011 4:03 PM

## 2011-12-19 DIAGNOSIS — G894 Chronic pain syndrome: Secondary | ICD-10-CM

## 2011-12-19 HISTORY — DX: Chronic pain syndrome: G89.4

## 2012-09-17 DIAGNOSIS — E113599 Type 2 diabetes mellitus with proliferative diabetic retinopathy without macular edema, unspecified eye: Secondary | ICD-10-CM

## 2012-09-17 HISTORY — DX: Type 2 diabetes mellitus with proliferative diabetic retinopathy without macular edema, unspecified eye: E11.3599

## 2013-02-24 DIAGNOSIS — H269 Unspecified cataract: Secondary | ICD-10-CM

## 2013-02-24 HISTORY — DX: Unspecified cataract: H26.9

## 2013-12-18 HISTORY — PX: EYE SURGERY: SHX253

## 2014-10-18 HISTORY — PX: CATARACT EXTRACTION: SUR2

## 2015-10-06 ENCOUNTER — Encounter: Payer: Self-pay | Admitting: Internal Medicine

## 2015-10-06 ENCOUNTER — Ambulatory Visit (INDEPENDENT_AMBULATORY_CARE_PROVIDER_SITE_OTHER): Payer: Self-pay | Admitting: Internal Medicine

## 2015-10-06 VITALS — BP 138/80 | HR 70 | Ht <= 58 in | Wt 134.0 lb

## 2015-10-06 DIAGNOSIS — R1012 Left upper quadrant pain: Secondary | ICD-10-CM

## 2015-10-06 DIAGNOSIS — E119 Type 2 diabetes mellitus without complications: Secondary | ICD-10-CM | POA: Insufficient documentation

## 2015-10-06 DIAGNOSIS — I10 Essential (primary) hypertension: Secondary | ICD-10-CM | POA: Insufficient documentation

## 2015-10-06 DIAGNOSIS — E1121 Type 2 diabetes mellitus with diabetic nephropathy: Secondary | ICD-10-CM | POA: Insufficient documentation

## 2015-10-06 DIAGNOSIS — M545 Low back pain, unspecified: Secondary | ICD-10-CM | POA: Insufficient documentation

## 2015-10-06 DIAGNOSIS — G8929 Other chronic pain: Secondary | ICD-10-CM

## 2015-10-06 DIAGNOSIS — N189 Chronic kidney disease, unspecified: Secondary | ICD-10-CM

## 2015-10-06 DIAGNOSIS — E113593 Type 2 diabetes mellitus with proliferative diabetic retinopathy without macular edema, bilateral: Secondary | ICD-10-CM

## 2015-10-06 LAB — GLUCOSE, POCT (MANUAL RESULT ENTRY)

## 2015-10-06 NOTE — Progress Notes (Signed)
   Subjective:    Patient ID: [name], female    DOB: 01/01/1961, 54 y.o.   MRN: [id number]  HPI  1.  Hypertension:  BP fine today.  Pt. Taking Amlodipine 5 mg in the evening and Lisinopril 20 mg in the morning.  Tolerating fine.    2.  DM with proliferative retinopathy:  Not checking sugars at home. Unable to see well enough to check and no one else to help her with this in the home.  Added Glipizide back in July when her A1C was found to be up to 10.0% and patient noted to have weight gain. Pt. Admitted to eating a lot of sweet foods then as well.  States she is eating much better now.  Eating fiber with starches.  Only eating 2 servings of vegetables daily. Weight is also up today, though patient states she feels she weighs less.  States energy is good.    3.  Left upper quadrant pain:  Feels like inflammation-states she means it is larger on that side than the right.  Feels like something is growing there.  When bends forward, feels like something sharp is sticking her there.  Feels better when stands up straight.  Often has to sit down in shower as gets dizzy when stands up straight once has pain.  No melena or hematochezia of which she is aware--unable to see adequately.  No diarrhea or constipation. No radiation of pain.  4.  Pain in left upper posterior pelvic area:  Pt. States fell while taking Trazodone.  Not clear if this is the same pain she presented to clinic with in 2013.  No numbness or tingling down the left leg.  Legs feel week.  No radiation of pain.  5.  Chest Pain:  Feels like a pinch and liquid running across her chest.  There and gone.  Has at rest only.      Review of Systems     Objective:   Physical Exam HEENT:  Blind in right eye with minimal vision in left--mainly sense of light.  TMs pearly gray, throat without injection. Neck:  Supple, no adenopathy, no thyromegaly Chest:  CTA, Tender over anterolateral rib cage, particulary where ribs lower  non floating ribs come together and articulate with lower sternum.  Palpation reproduces chest and LUQ pain CV: RRR with normal S1and S2, No S3, S4, or murmur appreciated.   MS:  Tender over posterior left pelvic rim--no overlying bruising.  Gait normal Neuro:  Motor 5/5 with DTRs 2+/4 bilateral LE throughout        Assessment & Plan:  1.  Hypertension:  Controlled   2.   DM:  Hemoglobin A1C, urine microalbumin. Pt. Was lost to care for a year until 02/2015 with findings of worsening CKD and worsening diabetic control.  Meds added in March for improved contro.l  3.  CKD:  As above.  Nephrology referral.  Check CBC, CMP  4.  Chest wall pain:  Rib Xrays  5.  Pelvic Rim pain/tenderness:  PT referral

## 2015-10-06 NOTE — Patient Instructions (Signed)
Por favor, necesite tarjeta naranja

## 2015-10-07 LAB — MICROALBUMIN, URINE: MICROALBUM., U, RANDOM: 3766.7 ug/mL

## 2015-10-07 LAB — COMPREHENSIVE METABOLIC PANEL
ALT: 57 IU/L — AB (ref 0–32)
AST: 47 IU/L — AB (ref 0–40)
Albumin/Globulin Ratio: 1.1 (ref 1.1–2.5)
Albumin: 3.9 g/dL (ref 3.5–5.5)
Alkaline Phosphatase: 166 IU/L — ABNORMAL HIGH (ref 39–117)
BUN/Creatinine Ratio: 16 (ref 9–23)
BUN: 39 mg/dL — AB (ref 6–24)
CALCIUM: 9.1 mg/dL (ref 8.7–10.2)
CHLORIDE: 103 mmol/L (ref 97–106)
CO2: 21 mmol/L (ref 18–29)
Creatinine, Ser: 2.39 mg/dL — ABNORMAL HIGH (ref 0.57–1.00)
GFR calc non Af Amer: 22 mL/min/{1.73_m2} — ABNORMAL LOW (ref >59)
GFR, EST AFRICAN AMERICAN: 26 mL/min/{1.73_m2} — AB (ref >59)
Globulin, Total: 3.4 g/dL (ref 1.5–4.5)
Glucose: 247 mg/dL — ABNORMAL HIGH (ref 65–99)
Potassium: 5 mmol/L (ref 3.5–5.2)
Sodium: 141 mmol/L (ref 136–144)
TOTAL PROTEIN: 7.3 g/dL (ref 6.0–8.5)

## 2015-10-07 LAB — CBC WITH DIFFERENTIAL/PLATELET
BASOS: 0 %
Basophils Absolute: 0 10*3/uL (ref 0.0–0.2)
EOS (ABSOLUTE): 0.1 10*3/uL (ref 0.0–0.4)
EOS: 1 %
HEMATOCRIT: 32.6 % — AB (ref 34.0–46.6)
HEMOGLOBIN: 11 g/dL — AB (ref 11.1–15.9)
IMMATURE GRANS (ABS): 0 10*3/uL (ref 0.0–0.1)
Immature Granulocytes: 0 %
LYMPHS: 25 %
Lymphocytes Absolute: 2.3 10*3/uL (ref 0.7–3.1)
MCH: 30.7 pg (ref 26.6–33.0)
MCHC: 33.7 g/dL (ref 31.5–35.7)
MCV: 91 fL (ref 79–97)
MONOCYTES: 6 %
Monocytes Absolute: 0.5 10*3/uL (ref 0.1–0.9)
NEUTROS ABS: 6.1 10*3/uL (ref 1.4–7.0)
Neutrophils: 68 %
Platelets: 274 10*3/uL (ref 150–379)
RBC: 3.58 x10E6/uL — ABNORMAL LOW (ref 3.77–5.28)
RDW: 14 % (ref 12.3–15.4)
WBC: 9.1 10*3/uL (ref 3.4–10.8)

## 2015-10-07 LAB — HEMOGLOBIN A1C
Est. average glucose Bld gHb Est-mCnc: 206 mg/dL
Hgb A1c MFr Bld: 8.8 % — ABNORMAL HIGH (ref 4.8–5.6)

## 2015-10-22 ENCOUNTER — Encounter: Payer: Self-pay | Admitting: Internal Medicine

## 2015-11-03 NOTE — Progress Notes (Signed)
Quick Note:  11/03/15 Patient received letter and came in for results. Dr.Mulberry went over all the information she needs to know. ______

## 2015-12-21 ENCOUNTER — Encounter: Payer: Self-pay | Admitting: Internal Medicine

## 2015-12-21 DIAGNOSIS — G894 Chronic pain syndrome: Secondary | ICD-10-CM | POA: Insufficient documentation

## 2015-12-21 DIAGNOSIS — I10 Essential (primary) hypertension: Secondary | ICD-10-CM | POA: Insufficient documentation

## 2015-12-21 DIAGNOSIS — E11319 Type 2 diabetes mellitus with unspecified diabetic retinopathy without macular edema: Secondary | ICD-10-CM | POA: Insufficient documentation

## 2015-12-21 DIAGNOSIS — N185 Chronic kidney disease, stage 5: Secondary | ICD-10-CM | POA: Insufficient documentation

## 2016-01-05 ENCOUNTER — Encounter: Payer: Self-pay | Admitting: Internal Medicine

## 2016-01-05 ENCOUNTER — Ambulatory Visit (INDEPENDENT_AMBULATORY_CARE_PROVIDER_SITE_OTHER): Payer: Self-pay | Admitting: Internal Medicine

## 2016-01-05 VITALS — BP 144/80 | HR 74 | Ht <= 58 in | Wt 136.0 lb

## 2016-01-05 DIAGNOSIS — F32A Depression, unspecified: Secondary | ICD-10-CM

## 2016-01-05 DIAGNOSIS — I10 Essential (primary) hypertension: Secondary | ICD-10-CM

## 2016-01-05 DIAGNOSIS — E113593 Type 2 diabetes mellitus with proliferative diabetic retinopathy without macular edema, bilateral: Secondary | ICD-10-CM

## 2016-01-05 DIAGNOSIS — F329 Major depressive disorder, single episode, unspecified: Secondary | ICD-10-CM

## 2016-01-05 DIAGNOSIS — N189 Chronic kidney disease, unspecified: Secondary | ICD-10-CM

## 2016-01-05 NOTE — Progress Notes (Signed)
   Subjective:    Patient ID: [name], female    DOB: Feb 08, 1961, 55 y.o.   MRN: [id number]  HPI  Orange card expired in August and pt. Just notified us last month that she has been unable to get it renewed, so referrals to Nephrology and PT written in October have not gone through.  1.  Hypertension:  States taking 5 mg of Amlodipine in the evening and 20 mg of Lisinopril in the morning every day.  Looking at pills left for month, appears she is taking adequately.  2.  DM:  A1C dropped to 8.8% in October 2016 from 10.0%  she had developed after being lost to care for 1 year when followed up again in March of 2016.  Has also developed worsening kidney function as well.  Last Creatinine in October was 2.39 up from 2.20 about 1 year earlier. She admittedly was not eating well in the spring.  It does not sound like she has made much of a change with eating. Ate very poorly over holidays. Not checking sugars--not able to find someone in family to do this for her as she cannot see. Denies symptoms of hypoglycemia.   Good foot care. Weight continues to climb. Asked patient if she feels she is depressed with her situation and if that is why she is not working on diet and physical activity Her son shares that since her surgery for a very large ovarian cyst in 2013, she has not been allowed by family to clean house or cook.  Her son, who had disabilities (unable to hear or speak) is nervous when she is in the kitchen__the family thinks he is concerned he will not be able to notify anyone if something happens to her while doing housework   Current outpatient prescriptions:  .  amLODipine (NORVASC) 5 MG tablet, Take 5 mg by mouth at bedtime., Disp: , Rfl:  .  glipiZIDE (GLUCOTROL XL) 5 MG 24 hr tablet, Take 5 mg by mouth daily with breakfast., Disp: , Rfl:  .  lisinopril (PRINIVIL,ZESTRIL) 20 MG tablet, Take 20 mg by mouth daily., Disp: , Rfl:    No Known Allergies     Review of  Systems     Objective:   Physical Exam Lungs:  CTA; CV:  RRR without murmur or rub. Radial pulses normal and equal LE:  Without edema Feet:  No concerning lesions.     Assessment & Plan:  1.  DM:  LOss of control due to weight gain and constant eating as well as bad diet.  Feel we will not get this under control until depression and sense of uselessness under control.  2.  CKD:  Son will get back as to whether they are making headway with orange card--have to now apply and get waiver from [name] as she has SS # and ID card. Otherwise, refer to [facility] do the same if unable to get into physician locally in next month.  3.  Hypertension:  Sounds like she is taking meds and was controlled previously.  Needs to get up and move around and lose weight.  4.  Depression:  Metta Clines to see if can get her family to support her being more active in the home--allow her to do things that make her feel useful again.

## 2016-01-05 NOTE — Patient Instructions (Addendum)
Tome un vaso de agua antes de cada comida Tome un minimo de 6 a 8 vasos de agua diarios Coma tres veces al dia Coma una proteina y Ardelia Mems grasa saludable con comida.  (huevos, pescado, pollo, pavo, y limite carnes rojas Coma 5 porciones diarias de legumbres.  Mezcle los colores Coma 2 porciones diarias de frutas con cascara cuando sea comestible Use platos pequeos Suelte su tenedor o cuchara despues de cada mordida hata que se mastique y se trague Come en la mesa con amigos o familiares por lo menos una vez al dia Apague la televisin y aparatos electrnicos durante la comida  Su objetivo debe ser perder Ardelia Mems libra por semana  Metta Clines will call to set up home visit

## 2016-01-21 ENCOUNTER — Other Ambulatory Visit (INDEPENDENT_AMBULATORY_CARE_PROVIDER_SITE_OTHER): Payer: Self-pay

## 2016-01-21 DIAGNOSIS — E113593 Type 2 diabetes mellitus with proliferative diabetic retinopathy without macular edema, bilateral: Secondary | ICD-10-CM

## 2016-01-22 LAB — BASIC METABOLIC PANEL
BUN/Creatinine Ratio: 11 (ref 9–23)
BUN: 30 mg/dL — ABNORMAL HIGH (ref 6–24)
CALCIUM: 9.2 mg/dL (ref 8.7–10.2)
CO2: 17 mmol/L — AB (ref 18–29)
CREATININE: 2.63 mg/dL — AB (ref 0.57–1.00)
Chloride: 99 mmol/L (ref 96–106)
GFR calc Af Amer: 23 mL/min/{1.73_m2} — ABNORMAL LOW (ref >59)
GFR, EST NON AFRICAN AMERICAN: 20 mL/min/{1.73_m2} — AB (ref >59)
Glucose: 230 mg/dL — ABNORMAL HIGH (ref 65–99)
POTASSIUM: 4.7 mmol/L (ref 3.5–5.2)
Sodium: 139 mmol/L (ref 134–144)

## 2016-01-22 LAB — HGB A1C W/O EAG: Hgb A1c MFr Bld: 9.8 % — ABNORMAL HIGH (ref 4.8–5.6)

## 2016-03-30 ENCOUNTER — Telehealth: Payer: Self-pay | Admitting: Licensed Clinical Social Worker

## 2016-03-30 NOTE — Telephone Encounter (Signed)
LCSW called pt to check-in regarding needs. Mobile number was busy. Left VM on home number requesting a call back.

## 2016-04-27 ENCOUNTER — Encounter: Payer: Self-pay | Admitting: Internal Medicine

## 2016-04-27 ENCOUNTER — Ambulatory Visit (INDEPENDENT_AMBULATORY_CARE_PROVIDER_SITE_OTHER): Payer: Self-pay | Admitting: Internal Medicine

## 2016-04-27 VITALS — BP 210/110 | HR 76 | Resp 20 | Ht <= 58 in | Wt 137.0 lb

## 2016-04-27 DIAGNOSIS — E113593 Type 2 diabetes mellitus with proliferative diabetic retinopathy without macular edema, bilateral: Secondary | ICD-10-CM

## 2016-04-27 DIAGNOSIS — I10 Essential (primary) hypertension: Secondary | ICD-10-CM

## 2016-04-27 DIAGNOSIS — Z23 Encounter for immunization: Secondary | ICD-10-CM

## 2016-04-27 DIAGNOSIS — N189 Chronic kidney disease, unspecified: Secondary | ICD-10-CM

## 2016-04-27 MED ORDER — AMLODIPINE BESYLATE 5 MG PO TABS: ORAL_TABLET | ORAL | Status: DC

## 2016-04-27 MED ORDER — LISINOPRIL 20 MG PO TABS: 20.0000 mg | ORAL_TABLET | Freq: Every day | ORAL | Status: DC

## 2016-04-27 MED ORDER — GLIPIZIDE ER 5 MG PO TB24: 5.0000 mg | ORAL_TABLET | Freq: Every day | ORAL | Status: DC

## 2016-04-27 NOTE — Progress Notes (Signed)
Subjective:    Patient ID: [name], female    DOB: October 08, 1961, 55 y.o.   MRN: [id number]  HPI   1.  Essential Hypertension:  Poor compliance again.  Her Amlodipine bottle is dated 01/22/2016 and she ran out of this med about 1 month ago.  She had 3 refills left at the time the prescription expired.  Discussed this tells me she missed the medication prior to February 90 days worth over the past year and she has been without this medication more likely 2 months.   Her husband is the one who gets her medication for her.   Asked husband to come in.  He states she just refuses to take oftentimes and so he stopped filling. Macie Burows, LCSW has attempted to contact and does not appear to have received call back.   Husband, [name], has asked that we call him as contact--updated his phone number in her chart. Pt. Ultimately gives multiple reasons why she keeps stopping--in past complained of dizziness.  Today she initially states it makes her nose bleeds.  Then states it makes her nauseated  2.  DM:  Not checking sugars.  STates she is taking Glipizide as she should. Did not get influenza this year--missed all 3 clinics. Has not had Pneumovax.    3. CKD:  Had an appt., but did not go without cancelling as did not have transportation.   They did not call here either to let us know.   4.  Insomnia:  Sleeps 2 hours at night.  Goes to bed at 8 pm.  Awakens at 1:30 in a.m.  No napping during day.  Lies in bed and awakens husband to ask the time.  Is physically active, walking around the house for 30 minutes daily (outside)  On many weekends, walks with friend while husband fishes.  Listens to a lot of TV and sits about otherwise, however.  Only eats twice daily--morning and early afternoon.  History of eating lot of carbs/desserts.   Current outpatient prescriptions:  .  glipiZIDE (GLUCOTROL XL) 5 MG 24 hr tablet, Take 5 mg by mouth daily with breakfast., Disp: , Rfl:  .  lisinopril  (PRINIVIL,ZESTRIL) 20 MG tablet, Take 20 mg by mouth daily., Disp: , Rfl:  .  amLODipine (NORVASC) 5 MG tablet, Take 5 mg by mouth at bedtime. Reported on 04/27/2016, Disp: , Rfl:    No Known Allergies      Review of Systems   No LE edema.  No chest pain, PND or orthopnea.       Objective:   Physical Exam   Poor color, appears chronically ill--pasty HEENT:  Sees some light.   Neck:  Supple, No adenopathy, no thyromegaly Chest:  CTA CV:  RRR with normal S1 and S2, No S3, S4 or murmur.  Radial and DP pulses normal and equal LE:  No edema.  Feet without lesion.        Assessment & Plan:  1.  Noncompliance:  Discussing same concerns again.  Discussed she will have MI, stroke, renal failure with her noncompliance with lifestyle and medication.  2.  Essential Hypertension:  Poorly controlled. Pt. Obviously not taking Amlodipine 7.5 mg, which has been the only antihypertensive that has shown a significant effect on her bp lowering without much in way of side effects.   Strongly urged her to take the medication long term and allow her body to equilibrate to the med.  She needs to avoid the  starting and stopping.  Continue Lisinopril as well.  3.  DM: Check A1C as pt. Unable to afford a glucometer for vision impaired.  Has been out of control when reestablished last summer with changes in diet and increased weight.  Previously, diet controlled in recent years, though poorly controlled by history years ago as well. Pneumovax 23 v today.  4.  CKD due to poorly controlled hypertension and DM:  Will rerefer to nephrology, but needs to call if develops barrier to keeping appt.  Discussed if she does not call to cancel 24 hours ahead, we will not be able to likely get her reappointed and she will cause difficulties with other patients on orange card getting specialty attention.  CMP and CBC today. Very concerned she is heading for dialysis soon.  5.  Insomnia:  Later, turns out she feels the  5-5 1/2 hours of sleep she gets daily is a refreshing sleep and she does not feel she needs more.  Encouraged her to get out of bed and allow husband to sleep.  Consider books on tape or music to listen to.

## 2016-04-28 LAB — CBC WITH DIFFERENTIAL/PLATELET
BASOS ABS: 0 10*3/uL (ref 0.0–0.2)
Basos: 0 %
EOS (ABSOLUTE): 0.2 10*3/uL (ref 0.0–0.4)
Eos: 2 %
Hematocrit: 31.2 % — ABNORMAL LOW (ref 34.0–46.6)
Hemoglobin: 10.3 g/dL — ABNORMAL LOW (ref 11.1–15.9)
Immature Grans (Abs): 0 10*3/uL (ref 0.0–0.1)
Immature Granulocytes: 0 %
LYMPHS ABS: 2.9 10*3/uL (ref 0.7–3.1)
Lymphs: 29 %
MCH: 28.3 pg (ref 26.6–33.0)
MCHC: 33 g/dL (ref 31.5–35.7)
MCV: 86 fL (ref 79–97)
MONOS ABS: 0.7 10*3/uL (ref 0.1–0.9)
Monocytes: 7 %
NEUTROS ABS: 6.4 10*3/uL (ref 1.4–7.0)
Neutrophils: 62 %
Platelets: 266 10*3/uL (ref 150–379)
RBC: 3.64 x10E6/uL — AB (ref 3.77–5.28)
RDW: 15 % (ref 12.3–15.4)
WBC: 10.3 10*3/uL (ref 3.4–10.8)

## 2016-04-28 LAB — COMPREHENSIVE METABOLIC PANEL
A/G RATIO: 1.1 — AB (ref 1.2–2.2)
ALK PHOS: 159 IU/L — AB (ref 39–117)
ALT: 12 IU/L (ref 0–32)
AST: 11 IU/L (ref 0–40)
Albumin: 3.3 g/dL — ABNORMAL LOW (ref 3.5–5.5)
BILIRUBIN TOTAL: 0.2 mg/dL (ref 0.0–1.2)
BUN/Creatinine Ratio: 10 (ref 9–23)
BUN: 33 mg/dL — ABNORMAL HIGH (ref 6–24)
CHLORIDE: 105 mmol/L (ref 96–106)
CO2: 17 mmol/L — AB (ref 18–29)
CREATININE: 3.21 mg/dL — AB (ref 0.57–1.00)
Calcium: 8.5 mg/dL — ABNORMAL LOW (ref 8.7–10.2)
GFR calc Af Amer: 18 mL/min/{1.73_m2} — ABNORMAL LOW (ref >59)
GFR calc non Af Amer: 16 mL/min/{1.73_m2} — ABNORMAL LOW (ref >59)
GLOBULIN, TOTAL: 3 g/dL (ref 1.5–4.5)
Glucose: 151 mg/dL — ABNORMAL HIGH (ref 65–99)
POTASSIUM: 3.8 mmol/L (ref 3.5–5.2)
SODIUM: 140 mmol/L (ref 134–144)
Total Protein: 6.3 g/dL (ref 6.0–8.5)

## 2016-04-28 LAB — HGB A1C W/O EAG: HEMOGLOBIN A1C: 10.8 % — AB (ref 4.8–5.6)

## 2016-05-03 ENCOUNTER — Ambulatory Visit (INDEPENDENT_AMBULATORY_CARE_PROVIDER_SITE_OTHER): Payer: Self-pay | Admitting: Licensed Clinical Social Worker

## 2016-05-03 DIAGNOSIS — F329 Major depressive disorder, single episode, unspecified: Secondary | ICD-10-CM

## 2016-05-03 DIAGNOSIS — F32A Depression, unspecified: Secondary | ICD-10-CM

## 2016-05-08 NOTE — Progress Notes (Signed)
   THERAPY PROGRESS NOTE  Session Time: 56min  Participation Level: Active  Behavioral Response: Casual and Well GroomedAlertEuthymic  Type of Therapy: Individual Therapy  Treatment Goals addressed: Coping  Interventions: Supportive  Summary: [name] is a 55 y.o. female who presents with a euthymic mood and appropriate affect. [name] reported that her primary care provider had referred her to counseling but that she did not feel she needed counseling. She shared about the side effects that she experiences with her blood pressure medicine. She reported that her doctor did not seem to understand her concerns. [name] shared about her experience losing most of her vision and how it has impacted her daily life. She reported that she still tries to stay active by walking outside of her house but that her family does not like her go outside of the house due to safety concerns. She reported that she enjoys listening to the radio or tv. She reported that while she gets down or stressed at times, she does not have any major concerns regarding her emotional or mental health. She shared about her relationships with her husband and her grown children. She stated that she did not feel that she needed counseling at this time but would call again in the future if needed.  Suicidal/Homicidal: Nowithout intent/plan  Therapist Response: LCSW began the clinical assessment but was unable to finish due to time constraints. LCSW utilized supportive counseling techniques throughout the session in order to validate emotions and encourage open expression of emotion. LCSW and [name] processed about her current level of activity and what she might be able to do to stay more active.  Plan: Return again in 0 weeks.  Diagnosis: Axis I: See current hospital problem list    Axis II: No diagnosis    Metta Clines, LCSW 05/08/2016

## 2016-05-12 ENCOUNTER — Ambulatory Visit (INDEPENDENT_AMBULATORY_CARE_PROVIDER_SITE_OTHER): Payer: Self-pay

## 2016-05-12 VITALS — BP 158/70 | HR 72

## 2016-05-12 DIAGNOSIS — I10 Essential (primary) hypertension: Secondary | ICD-10-CM

## 2016-05-12 DIAGNOSIS — E113593 Type 2 diabetes mellitus with proliferative diabetic retinopathy without macular edema, bilateral: Secondary | ICD-10-CM

## 2016-05-12 LAB — GLUCOSE, POCT (MANUAL RESULT ENTRY): POC GLUCOSE: 290 mg/dL — AB (ref 70–99)

## 2016-06-09 ENCOUNTER — Telehealth: Payer: Self-pay | Admitting: Internal Medicine

## 2016-06-09 NOTE — Telephone Encounter (Signed)
[name] DOB October 28, 1961 has an appointment with [name] on Tuesday, June 27th at 7:45 am. Appointments confirmed today. Appointment information was received by mail and the patient's son also came in to the office to get another copy of the appt. Letters. [name] DOB [birth year] has an appointment with [name] on 06/16/16 at 10:00 AM

## 2016-06-13 ENCOUNTER — Telehealth: Payer: Self-pay | Admitting: Internal Medicine

## 2016-06-13 ENCOUNTER — Other Ambulatory Visit: Payer: Self-pay

## 2016-06-13 DIAGNOSIS — E113593 Type 2 diabetes mellitus with proliferative diabetic retinopathy without macular edema, bilateral: Secondary | ICD-10-CM

## 2016-06-13 MED ORDER — GLIPIZIDE ER 5 MG PO TB24: 5.0000 mg | ORAL_TABLET | Freq: Every day | ORAL | Status: DC

## 2016-06-13 NOTE — Telephone Encounter (Signed)
Patient stopped by office to request refill of Rx's (Glipizide ER 5mg  tab SR 24 HR 5 mg) and (Lisinopril 20 mg tab).  Patient wants Rx called into [facility] on [address]  [phone].

## 2016-06-16 ENCOUNTER — Other Ambulatory Visit: Payer: Self-pay | Admitting: Internal Medicine

## 2016-06-16 ENCOUNTER — Other Ambulatory Visit: Payer: Self-pay

## 2016-06-16 DIAGNOSIS — R1012 Left upper quadrant pain: Secondary | ICD-10-CM

## 2016-06-16 DIAGNOSIS — M25551 Pain in right hip: Secondary | ICD-10-CM

## 2016-07-14 ENCOUNTER — Other Ambulatory Visit: Payer: Self-pay | Admitting: Nephrology

## 2016-07-14 DIAGNOSIS — N184 Chronic kidney disease, stage 4 (severe): Secondary | ICD-10-CM

## 2016-07-20 ENCOUNTER — Other Ambulatory Visit: Payer: Self-pay

## 2016-07-27 ENCOUNTER — Ambulatory Visit: Payer: No Typology Code available for payment source | Admitting: Internal Medicine

## 2016-08-03 ENCOUNTER — Ambulatory Visit (INDEPENDENT_AMBULATORY_CARE_PROVIDER_SITE_OTHER): Payer: No Typology Code available for payment source | Admitting: Internal Medicine

## 2016-08-03 VITALS — BP 132/80 | HR 80 | Resp 14 | Ht <= 58 in | Wt 139.0 lb

## 2016-08-03 DIAGNOSIS — I10 Essential (primary) hypertension: Secondary | ICD-10-CM

## 2016-08-03 DIAGNOSIS — E113593 Type 2 diabetes mellitus with proliferative diabetic retinopathy without macular edema, bilateral: Secondary | ICD-10-CM

## 2016-08-03 DIAGNOSIS — E1121 Type 2 diabetes mellitus with diabetic nephropathy: Secondary | ICD-10-CM

## 2016-08-03 DIAGNOSIS — M545 Low back pain, unspecified: Secondary | ICD-10-CM

## 2016-08-03 DIAGNOSIS — G894 Chronic pain syndrome: Secondary | ICD-10-CM

## 2016-08-03 LAB — GLUCOSE, POCT (MANUAL RESULT ENTRY): POC Glucose: 246 mg/dl — AB (ref 70–99)

## 2016-08-03 MED ORDER — METAXALONE 400 MG PO TABS: ORAL_TABLET | ORAL | 0 refills | Status: DC

## 2016-08-03 NOTE — Progress Notes (Signed)
Subjective:    Patient ID: [name], female    DOB: 20-Jan-1961, 55 y.o.   MRN: [id number]  HPI   1.  Essential Hypertension:  Has had bp checked in pharmacy as well and bp has been better controlled.  Still only taking Amlodipine 5 mg once daily and not the 7.5 mg.  Also taking Lisinopril 20 mg daily.  2.  CRF:  States has been seen by Nephrology.  Has an upcoming appt. She thinks on the 22nd of this month, but lost the papers and cannot remember his name or the time. Sounds like she underwent possibly renal ultrasound, but she is not able to adequately describe.  Also had significant blood work done, but no changes or additions to medication.  I have not seen documentation of the office visit.  3.  DM  Type 2:  No one checking her sugars.  She is unable to see to do herself.   Describes an enormous breakfast with 3 eggs, 2 pork sausages, with onions, tomatoes, 2 pieces of whole grain toast.  Coffee, denies sugar.   Walks in yard and stationery bike every day. Discussed her continued weight gain. No problems with numbness and tingling in her feet.   Has had some fluid on he feet.  4.  Previous Right Hip Pain;  Xray returned 06/16/2016:  Mild OA with small osteophytes noted along the lateral acetabulum.  Current Meds  Medication Sig  . amLODipine (NORVASC) 5 MG tablet 1 1/2 tab by mouth with snack in the evening (Patient taking differently: Take 5 mg by mouth daily. 1 1/2 tab by mouth with snack in the evening)  . glipiZIDE (GLUCOTROL XL) 5 MG 24 hr tablet Take 1 tablet (5 mg total) by mouth daily with breakfast.  . lisinopril (PRINIVIL,ZESTRIL) 20 MG tablet Take 1 tablet (20 mg total) by mouth daily.   No Known Allergies   Review of Systems     Objective:   Physical Exam Lab Results  Component Value Date   POCGLU 246 (A) 08/03/2016   NAD Obese, chronically ill appearing Neck:  Supple, no adenopathy Chest:  CTA CV:  RRR without murmur or rub, radial pulses  normal and equal. Abd:  S, NT, No HSM or masses, + BS Gait is easy to exam table Complains of pain in her low back bilaterally during exam.  Tender bilaterally over L/S paraspinous musculature       Assessment & Plan:  1.  DM Type 2 with noncompliance:  Patient continues to consume large amounts of food, gain weight with worsening diabetic control.   Long discussion regarding control to prevent further deterioration of vision left and renal function as well as as other DM complications.  Not clear she is motivated.  Not clear if depressed or bored or both due to visual limitations. She has not been interested in further counseling to get insight into why she has changed her lifestyle so significantly in past 1 1/2 years.   Will continue to encourage interaction with LCSW, Macie Burows.  2.  Diabetic Nephropathy:  Will check with [facility] to find out her outcome from Nephrology visit and when she is to follow up.  3.  Essential Hypertension:  Surprisingly controlled today, despite not being on recommended dose of Amlodipine.  4.  Low Back Pain:  Skelaxin as needed.  No NSAIDS.  May take Tylenol twice daily as needed as well.  Fasting labs in next week:  CBC, CMP, FLP,  TSH, A1C, otherwise follow up in 3 months.

## 2016-08-06 ENCOUNTER — Encounter: Payer: Self-pay | Admitting: Internal Medicine

## 2016-08-07 ENCOUNTER — Telehealth: Payer: Self-pay | Admitting: Internal Medicine

## 2016-08-11 ENCOUNTER — Other Ambulatory Visit (INDEPENDENT_AMBULATORY_CARE_PROVIDER_SITE_OTHER): Payer: Self-pay

## 2016-08-11 DIAGNOSIS — I10 Essential (primary) hypertension: Secondary | ICD-10-CM

## 2016-08-11 DIAGNOSIS — Z1329 Encounter for screening for other suspected endocrine disorder: Secondary | ICD-10-CM

## 2016-08-11 DIAGNOSIS — E785 Hyperlipidemia, unspecified: Secondary | ICD-10-CM

## 2016-08-11 DIAGNOSIS — N189 Chronic kidney disease, unspecified: Secondary | ICD-10-CM

## 2016-08-11 DIAGNOSIS — E113593 Type 2 diabetes mellitus with proliferative diabetic retinopathy without macular edema, bilateral: Secondary | ICD-10-CM

## 2016-08-12 LAB — COMPREHENSIVE METABOLIC PANEL
ALK PHOS: 194 IU/L — AB (ref 39–117)
ALT: 12 IU/L (ref 0–32)
AST: 11 IU/L (ref 0–40)
Albumin/Globulin Ratio: 1.2 (ref 1.2–2.2)
Albumin: 3.7 g/dL (ref 3.5–5.5)
BUN/Creatinine Ratio: 9 (ref 9–23)
BUN: 41 mg/dL — ABNORMAL HIGH (ref 6–24)
Bilirubin Total: 0.3 mg/dL (ref 0.0–1.2)
CALCIUM: 9.3 mg/dL (ref 8.7–10.2)
CHLORIDE: 107 mmol/L — AB (ref 96–106)
CO2: 20 mmol/L (ref 18–29)
CREATININE: 4.35 mg/dL — AB (ref 0.57–1.00)
GFR calc non Af Amer: 11 mL/min/{1.73_m2} — ABNORMAL LOW (ref >59)
GFR, EST AFRICAN AMERICAN: 12 mL/min/{1.73_m2} — AB (ref >59)
GLOBULIN, TOTAL: 3.2 g/dL (ref 1.5–4.5)
GLUCOSE: 165 mg/dL — AB (ref 65–99)
Potassium: 4.4 mmol/L (ref 3.5–5.2)
Sodium: 144 mmol/L (ref 134–144)
Total Protein: 6.9 g/dL (ref 6.0–8.5)

## 2016-08-12 LAB — CBC WITH DIFFERENTIAL/PLATELET
BASOS ABS: 0.1 10*3/uL (ref 0.0–0.2)
Basos: 1 %
EOS (ABSOLUTE): 0.2 10*3/uL (ref 0.0–0.4)
Eos: 2 %
HEMOGLOBIN: 10.8 g/dL — AB (ref 11.1–15.9)
Hematocrit: 33.2 % — ABNORMAL LOW (ref 34.0–46.6)
IMMATURE GRANS (ABS): 0 10*3/uL (ref 0.0–0.1)
IMMATURE GRANULOCYTES: 0 %
LYMPHS: 24 %
Lymphocytes Absolute: 2 10*3/uL (ref 0.7–3.1)
MCH: 28.9 pg (ref 26.6–33.0)
MCHC: 32.5 g/dL (ref 31.5–35.7)
MCV: 89 fL (ref 79–97)
MONOCYTES: 5 %
Monocytes Absolute: 0.4 10*3/uL (ref 0.1–0.9)
NEUTROS ABS: 5.8 10*3/uL (ref 1.4–7.0)
NEUTROS PCT: 68 %
PLATELETS: 274 10*3/uL (ref 150–379)
RBC: 3.74 x10E6/uL — AB (ref 3.77–5.28)
RDW: 14.5 % (ref 12.3–15.4)
WBC: 8.5 10*3/uL (ref 3.4–10.8)

## 2016-08-12 LAB — LIPID PANEL W/O CHOL/HDL RATIO
CHOLESTEROL TOTAL: 269 mg/dL — AB (ref 100–199)
HDL: 36 mg/dL — AB (ref >39)
LDL CALC: 156 mg/dL — AB (ref 0–99)
TRIGLYCERIDES: 384 mg/dL — AB (ref 0–149)
VLDL CHOLESTEROL CAL: 77 mg/dL — AB (ref 5–40)

## 2016-08-12 LAB — HGB A1C W/O EAG: Hgb A1c MFr Bld: 10 % — ABNORMAL HIGH (ref 4.8–5.6)

## 2016-08-12 LAB — TSH: TSH: 7.58 u[IU]/mL — AB (ref 0.450–4.500)

## 2016-08-15 ENCOUNTER — Encounter: Payer: Self-pay | Admitting: Internal Medicine

## 2016-08-15 DIAGNOSIS — E0781 Sick-euthyroid syndrome: Secondary | ICD-10-CM | POA: Insufficient documentation

## 2016-08-15 LAB — T4, FREE: Free T4: 1.43 ng/dL (ref 0.82–1.77)

## 2016-08-15 LAB — SPECIMEN STATUS REPORT

## 2016-08-15 MED ORDER — ATORVASTATIN CALCIUM 20 MG PO TABS: ORAL_TABLET | ORAL | 11 refills | Status: DC

## 2016-08-17 NOTE — Telephone Encounter (Signed)
Unable to contact patient. Called [facility] and patient was a no show for office visit. Left voicemail to have the scheduler at [facility] return my call and rescheduled appointment

## 2016-08-18 NOTE — Telephone Encounter (Signed)
Per Pam- Patient's appointment rescheduled for 09/04/16 @ 12:45 PM. $25 fee for no show was waived this time only.  Patient to receive appointment letter via mail and phone call 3 days prior to appointment. Patient informed by Johnnye Lana today

## 2016-09-13 ENCOUNTER — Other Ambulatory Visit: Payer: Self-pay | Admitting: Internal Medicine

## 2016-09-13 DIAGNOSIS — M545 Low back pain, unspecified: Secondary | ICD-10-CM

## 2016-09-13 NOTE — Telephone Encounter (Signed)
To md

## 2016-09-14 NOTE — Telephone Encounter (Signed)
To MD

## 2016-09-26 ENCOUNTER — Other Ambulatory Visit: Payer: Self-pay

## 2016-09-26 DIAGNOSIS — Z01810 Encounter for preprocedural cardiovascular examination: Secondary | ICD-10-CM

## 2016-09-26 DIAGNOSIS — N186 End stage renal disease: Secondary | ICD-10-CM

## 2016-09-28 ENCOUNTER — Other Ambulatory Visit: Payer: Self-pay

## 2016-10-27 ENCOUNTER — Encounter: Payer: Self-pay | Admitting: Vascular Surgery

## 2016-11-02 ENCOUNTER — Ambulatory Visit: Payer: Self-pay | Admitting: Internal Medicine

## 2016-11-03 ENCOUNTER — Ambulatory Visit (HOSPITAL_COMMUNITY): Payer: Self-pay | Attending: Vascular Surgery

## 2016-11-03 ENCOUNTER — Encounter: Payer: Self-pay | Admitting: Vascular Surgery

## 2016-11-03 ENCOUNTER — Ambulatory Visit (HOSPITAL_COMMUNITY): Admission: RE | Admit: 2016-11-03 | Payer: Self-pay | Source: Ambulatory Visit

## 2016-12-28 ENCOUNTER — Ambulatory Visit: Payer: Self-pay | Admitting: Internal Medicine

## 2017-01-09 ENCOUNTER — Encounter: Payer: Self-pay | Admitting: Vascular Surgery

## 2017-01-16 ENCOUNTER — Inpatient Hospital Stay (HOSPITAL_COMMUNITY): Admission: RE | Admit: 2017-01-16 | Payer: Self-pay | Source: Ambulatory Visit

## 2017-01-16 ENCOUNTER — Encounter: Payer: Self-pay | Admitting: Vascular Surgery

## 2017-01-16 ENCOUNTER — Encounter (HOSPITAL_COMMUNITY): Payer: Self-pay

## 2017-01-30 ENCOUNTER — Other Ambulatory Visit (HOSPITAL_COMMUNITY): Payer: Self-pay | Admitting: *Deleted

## 2017-01-31 ENCOUNTER — Encounter (HOSPITAL_COMMUNITY): Payer: Self-pay

## 2017-04-29 ENCOUNTER — Other Ambulatory Visit: Payer: Self-pay | Admitting: Internal Medicine

## 2017-06-30 ENCOUNTER — Other Ambulatory Visit: Payer: Self-pay | Admitting: Internal Medicine

## 2017-07-09 ENCOUNTER — Other Ambulatory Visit: Payer: Self-pay | Admitting: Internal Medicine

## 2017-07-09 ENCOUNTER — Encounter: Payer: Self-pay | Admitting: Vascular Surgery

## 2017-07-12 ENCOUNTER — Other Ambulatory Visit (INDEPENDENT_AMBULATORY_CARE_PROVIDER_SITE_OTHER): Payer: Self-pay

## 2017-07-12 DIAGNOSIS — Z79899 Other long term (current) drug therapy: Secondary | ICD-10-CM

## 2017-07-12 DIAGNOSIS — E782 Mixed hyperlipidemia: Secondary | ICD-10-CM

## 2017-07-12 DIAGNOSIS — E118 Type 2 diabetes mellitus with unspecified complications: Secondary | ICD-10-CM

## 2017-07-12 DIAGNOSIS — Z13220 Encounter for screening for lipoid disorders: Secondary | ICD-10-CM

## 2017-07-13 LAB — COMPREHENSIVE METABOLIC PANEL
A/G RATIO: 1.3 (ref 1.2–2.2)
ALK PHOS: 326 IU/L — AB (ref 39–117)
ALT: 12 IU/L (ref 0–32)
AST: 8 IU/L (ref 0–40)
Albumin: 4.1 g/dL (ref 3.5–5.5)
BUN/Creatinine Ratio: 10 (ref 9–23)
BUN: 65 mg/dL — ABNORMAL HIGH (ref 6–24)
CHLORIDE: 107 mmol/L — AB (ref 96–106)
CO2: 17 mmol/L — ABNORMAL LOW (ref 20–29)
Calcium: 8.4 mg/dL — ABNORMAL LOW (ref 8.7–10.2)
Creatinine, Ser: 6.82 mg/dL — ABNORMAL HIGH (ref 0.57–1.00)
GFR calc non Af Amer: 6 mL/min/{1.73_m2} — ABNORMAL LOW (ref >59)
GFR, EST AFRICAN AMERICAN: 7 mL/min/{1.73_m2} — AB (ref >59)
Globulin, Total: 3.2 g/dL (ref 1.5–4.5)
Glucose: 86 mg/dL (ref 65–99)
POTASSIUM: 5.1 mmol/L (ref 3.5–5.2)
Sodium: 144 mmol/L (ref 134–144)
Total Protein: 7.3 g/dL (ref 6.0–8.5)

## 2017-07-13 LAB — HGB A1C W/O EAG: HEMOGLOBIN A1C: 7.3 % — AB (ref 4.8–5.6)

## 2017-07-13 LAB — LIPID PANEL W/O CHOL/HDL RATIO
CHOLESTEROL TOTAL: 207 mg/dL — AB (ref 100–199)
HDL: 39 mg/dL — ABNORMAL LOW (ref >39)
LDL Calculated: 136 mg/dL — ABNORMAL HIGH (ref 0–99)
Triglycerides: 161 mg/dL — ABNORMAL HIGH (ref 0–149)
VLDL Cholesterol Cal: 32 mg/dL (ref 5–40)

## 2017-07-19 ENCOUNTER — Encounter: Payer: Self-pay | Admitting: Vascular Surgery

## 2017-07-19 ENCOUNTER — Encounter (HOSPITAL_COMMUNITY): Payer: Self-pay

## 2017-07-19 ENCOUNTER — Other Ambulatory Visit (HOSPITAL_COMMUNITY): Payer: Self-pay

## 2017-07-28 MED ORDER — ATORVASTATIN CALCIUM 40 MG PO TABS: ORAL_TABLET | ORAL | 11 refills | Status: AC

## 2017-07-28 MED ORDER — AMLODIPINE BESYLATE 10 MG PO TABS: ORAL_TABLET | ORAL | 11 refills | Status: DC

## 2017-07-28 NOTE — Addendum Note (Signed)
Addended by: Marcelino Duster on: 07/28/2017 03:41 PM   Modules accepted: Orders

## 2017-08-03 ENCOUNTER — Encounter: Payer: Self-pay | Admitting: Internal Medicine

## 2017-09-06 ENCOUNTER — Encounter: Payer: Self-pay | Admitting: Internal Medicine

## 2017-09-06 ENCOUNTER — Ambulatory Visit (INDEPENDENT_AMBULATORY_CARE_PROVIDER_SITE_OTHER): Payer: Self-pay | Admitting: Internal Medicine

## 2017-09-06 VITALS — BP 158/80 | HR 72 | Resp 12 | Ht <= 58 in | Wt 130.0 lb

## 2017-09-06 DIAGNOSIS — R600 Localized edema: Secondary | ICD-10-CM

## 2017-09-06 DIAGNOSIS — E1121 Type 2 diabetes mellitus with diabetic nephropathy: Secondary | ICD-10-CM

## 2017-09-06 DIAGNOSIS — E113593 Type 2 diabetes mellitus with proliferative diabetic retinopathy without macular edema, bilateral: Secondary | ICD-10-CM

## 2017-09-06 DIAGNOSIS — I10 Essential (primary) hypertension: Secondary | ICD-10-CM

## 2017-09-06 LAB — GLUCOSE, POCT (MANUAL RESULT ENTRY): POC Glucose: 153 mg/dl — AB (ref 70–99)

## 2017-09-06 MED ORDER — AMLODIPINE BESYLATE 5 MG PO TABS: ORAL_TABLET | ORAL | 11 refills | Status: DC

## 2017-09-06 NOTE — Patient Instructions (Signed)
Free flu vaccine at flu vaccine clinics:  September 20 8:30 to 11:30 a.m. And September 29th [facility] 1-4 p.m.  Both at [facility] next door to clinic  

## 2017-09-06 NOTE — Progress Notes (Addendum)
Subjective:    Patient ID: [name], female    DOB: 13-Oct-1961, 56 y.o.   MRN: [id number]  HPI   Interpreted with Patrica Duel, volunteer interpreter  1.  Essential Hypertension: Not taking Amlodipine 10 mg daily, which was increased to on 07/28/2017.  Is back to taking only 5 mg daily.   Not clear what happened. She is not taking Lisinopril as well, states her nephrologist stopped this in December, though I thought she was still taking until 08/03/2017 and stopped it after her last labs.  She thinks she last saw Nephrology ([facility]) in June.  Previously, patient had stated she would not go back to the Nephrologist.  Forde Dandy with family was that she did not share she had appointments and so did not go.   Sounds like they are discussing having an AV fistula placed.   She was not a candidate to be seen at [facility] for her CKD. Patient states she has told Nephrology she does not want the fistula for dialysis.  She is concerned she will get a "virus"  Like someone else she knows about who had this done and died. Discussed she will ultimately die from kidney failure and also discussed complications of not treating with dialysis:  with electrolytes, heart rhythm problems, fluid status and subsequent dyspnea, edema. She states she understands and still does not want dialysis. Also discussed code status:  If she is not going to consider dialysis, she needs to contemplate DNR. She is contemplating returning home to [place] to live out the rest of her life.  Has a good appetite and feels she is doing well currently.  No edema, no dyspnea.    2.  DM:  Last A1C was much improved with A1C was 7.3% 07/12/2017. She did not get her free influenza vaccine.  3.  Hyperlipidemia:  Does not appear to be taking Atorvastatin 40 mg daily as she still has a bottle with a couple of pills left dated 07/28/2017. Her lipids were not at goal in July on the 20 mg. Cannot get a good history as to  what she is doing with her meds. Lipid Panel     Component Value Date/Time   CHOL 207 (H) 07/12/2017 0900   TRIG 161 (H) 07/12/2017 0900   HDL 39 (L) 07/12/2017 0900   LDLCALC 136 (H) 07/12/2017 0900      Review of Systems     Objective:   Physical Exam  Chronically ill appearing. Periorbital edema and with moon facies Neck:  No JVD Lungs:  CTA CV:  RRR without murmur or rub, radial pulses normal and equal No LE edema Abd:  Obese, + BS, No HSM or mass, NT          Assessment & Plan:  1.  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  Very clear she would ultimately die as a result of a complication of her renal failure.  She was able to repeat back the information given and seems to understand.   She will contemplate her code status as well, but was not ready to make a decision on that today. Sounds like she plans to go back to [place] in December and live out her days there. BMP today  2.  DM;  Currently much better controlled.  Sounds like she is much more physically active.  3.  Facial edema:  Recheck TSH, though could be due to nephrotic  syndrome and decreased oncotic pressure, though no LE edema currently.  4.  Hyperlipidemia:  To make sure she has been taking 40 mg of Atorvastatin daily as does not appear she has done so for past week.  FLP and CMP in 6 weeks instead of in 3 weeks  Followup after her lab appointment

## 2017-09-07 LAB — BASIC METABOLIC PANEL
BUN/Creatinine Ratio: 9 (ref 9–23)
BUN: 66 mg/dL — ABNORMAL HIGH (ref 6–24)
CO2: 15 mmol/L — ABNORMAL LOW (ref 20–29)
CREATININE: 7.49 mg/dL — AB (ref 0.57–1.00)
Calcium: 8.2 mg/dL — ABNORMAL LOW (ref 8.7–10.2)
Chloride: 107 mmol/L — ABNORMAL HIGH (ref 96–106)
GFR, EST AFRICAN AMERICAN: 6 mL/min/{1.73_m2} — AB (ref >59)
GFR, EST NON AFRICAN AMERICAN: 6 mL/min/{1.73_m2} — AB (ref >59)
Glucose: 122 mg/dL — ABNORMAL HIGH (ref 65–99)
POTASSIUM: 4.9 mmol/L (ref 3.5–5.2)
SODIUM: 142 mmol/L (ref 134–144)

## 2017-09-07 LAB — TSH: TSH: 5.67 u[IU]/mL — ABNORMAL HIGH (ref 0.450–4.500)

## 2017-09-14 ENCOUNTER — Other Ambulatory Visit: Payer: Self-pay

## 2017-10-16 ENCOUNTER — Other Ambulatory Visit (HOSPITAL_COMMUNITY): Payer: Self-pay | Admitting: *Deleted

## 2017-10-17 ENCOUNTER — Inpatient Hospital Stay (HOSPITAL_COMMUNITY): Admission: RE | Admit: 2017-10-17 | Payer: Self-pay | Source: Ambulatory Visit

## 2017-11-05 ENCOUNTER — Other Ambulatory Visit: Payer: Self-pay

## 2017-11-05 DIAGNOSIS — Z13220 Encounter for screening for lipoid disorders: Secondary | ICD-10-CM

## 2017-11-05 DIAGNOSIS — Z79899 Other long term (current) drug therapy: Secondary | ICD-10-CM

## 2017-11-06 LAB — COMPREHENSIVE METABOLIC PANEL
ALBUMIN: 4 g/dL (ref 3.5–5.5)
ALK PHOS: 325 IU/L — AB (ref 39–117)
ALT: 35 IU/L — ABNORMAL HIGH (ref 0–32)
AST: 17 IU/L (ref 0–40)
Albumin/Globulin Ratio: 1.3 (ref 1.2–2.2)
BUN / CREAT RATIO: 9 (ref 9–23)
BUN: 84 mg/dL — AB (ref 6–24)
Bilirubin Total: 0.2 mg/dL (ref 0.0–1.2)
CO2: 16 mmol/L — AB (ref 20–29)
CREATININE: 9.23 mg/dL — AB (ref 0.57–1.00)
Calcium: 7.4 mg/dL — ABNORMAL LOW (ref 8.7–10.2)
Chloride: 113 mmol/L — ABNORMAL HIGH (ref 96–106)
GFR, EST AFRICAN AMERICAN: 5 mL/min/{1.73_m2} — AB (ref >59)
GFR, EST NON AFRICAN AMERICAN: 4 mL/min/{1.73_m2} — AB (ref >59)
GLOBULIN, TOTAL: 3 g/dL (ref 1.5–4.5)
GLUCOSE: 118 mg/dL — AB (ref 65–99)
Potassium: 5 mmol/L (ref 3.5–5.2)
SODIUM: 145 mmol/L — AB (ref 134–144)
TOTAL PROTEIN: 7 g/dL (ref 6.0–8.5)

## 2017-11-06 LAB — LIPID PANEL W/O CHOL/HDL RATIO
CHOLESTEROL TOTAL: 105 mg/dL (ref 100–199)
HDL: 37 mg/dL — AB (ref >39)
LDL Calculated: 47 mg/dL (ref 0–99)
TRIGLYCERIDES: 105 mg/dL (ref 0–149)
VLDL CHOLESTEROL CAL: 21 mg/dL (ref 5–40)

## 2017-11-08 ENCOUNTER — Ambulatory Visit: Payer: Self-pay | Admitting: Internal Medicine

## 2017-11-14 ENCOUNTER — Inpatient Hospital Stay (HOSPITAL_COMMUNITY)
Admission: EM | Admit: 2017-11-14 | Discharge: 2017-11-23 | DRG: 981 | Disposition: A | Discharge status: Home / Self Care | Payer: Self-pay | Attending: Internal Medicine | Admitting: Internal Medicine

## 2017-11-14 ENCOUNTER — Other Ambulatory Visit: Payer: Self-pay

## 2017-11-14 ENCOUNTER — Emergency Department (HOSPITAL_COMMUNITY): Payer: Self-pay

## 2017-11-14 ENCOUNTER — Encounter (HOSPITAL_COMMUNITY): Payer: Self-pay | Admitting: *Deleted

## 2017-11-14 DIAGNOSIS — E0822 Diabetes mellitus due to underlying condition with diabetic chronic kidney disease: Secondary | ICD-10-CM | POA: Diagnosis not present

## 2017-11-14 DIAGNOSIS — E872 Acidosis, unspecified: Secondary | ICD-10-CM | POA: Diagnosis present

## 2017-11-14 DIAGNOSIS — E1121 Type 2 diabetes mellitus with diabetic nephropathy: Secondary | ICD-10-CM | POA: Diagnosis present

## 2017-11-14 DIAGNOSIS — Z90710 Acquired absence of both cervix and uterus: Secondary | ICD-10-CM

## 2017-11-14 DIAGNOSIS — I471 Supraventricular tachycardia: Secondary | ICD-10-CM | POA: Diagnosis not present

## 2017-11-14 DIAGNOSIS — I10 Essential (primary) hypertension: Secondary | ICD-10-CM | POA: Diagnosis present

## 2017-11-14 DIAGNOSIS — D631 Anemia in chronic kidney disease: Secondary | ICD-10-CM

## 2017-11-14 DIAGNOSIS — E11319 Type 2 diabetes mellitus with unspecified diabetic retinopathy without macular edema: Secondary | ICD-10-CM | POA: Diagnosis present

## 2017-11-14 DIAGNOSIS — N185 Chronic kidney disease, stage 5: Secondary | ICD-10-CM | POA: Diagnosis present

## 2017-11-14 DIAGNOSIS — Z79899 Other long term (current) drug therapy: Secondary | ICD-10-CM

## 2017-11-14 DIAGNOSIS — I120 Hypertensive chronic kidney disease with stage 5 chronic kidney disease or end stage renal disease: Secondary | ICD-10-CM | POA: Diagnosis present

## 2017-11-14 DIAGNOSIS — Z9842 Cataract extraction status, left eye: Secondary | ICD-10-CM

## 2017-11-14 DIAGNOSIS — I479 Paroxysmal tachycardia, unspecified: Secondary | ICD-10-CM

## 2017-11-14 DIAGNOSIS — Z7984 Long term (current) use of oral hypoglycemic drugs: Secondary | ICD-10-CM

## 2017-11-14 DIAGNOSIS — Z992 Dependence on renal dialysis: Secondary | ICD-10-CM

## 2017-11-14 DIAGNOSIS — D649 Anemia, unspecified: Secondary | ICD-10-CM | POA: Diagnosis present

## 2017-11-14 DIAGNOSIS — I509 Heart failure, unspecified: Secondary | ICD-10-CM

## 2017-11-14 DIAGNOSIS — Z23 Encounter for immunization: Secondary | ICD-10-CM

## 2017-11-14 DIAGNOSIS — D638 Anemia in other chronic diseases classified elsewhere: Secondary | ICD-10-CM | POA: Diagnosis present

## 2017-11-14 DIAGNOSIS — E119 Type 2 diabetes mellitus without complications: Secondary | ICD-10-CM

## 2017-11-14 DIAGNOSIS — N186 End stage renal disease: Secondary | ICD-10-CM | POA: Diagnosis present

## 2017-11-14 DIAGNOSIS — J189 Pneumonia, unspecified organism: Principal | ICD-10-CM | POA: Diagnosis present

## 2017-11-14 DIAGNOSIS — Z888 Allergy status to other drugs, medicaments and biological substances status: Secondary | ICD-10-CM

## 2017-11-14 DIAGNOSIS — E1122 Type 2 diabetes mellitus with diabetic chronic kidney disease: Secondary | ICD-10-CM | POA: Diagnosis present

## 2017-11-14 HISTORY — DX: Pneumonia, unspecified organism: J18.9

## 2017-11-14 HISTORY — DX: Migraine, unspecified, not intractable, without status migrainosus: G43.909

## 2017-11-14 HISTORY — DX: Personal history of other medical treatment: Z92.89

## 2017-11-14 HISTORY — DX: End stage renal disease: N18.6

## 2017-11-14 HISTORY — DX: End stage renal disease: Z99.2

## 2017-11-14 HISTORY — DX: Pure hypercholesterolemia, unspecified: E78.00

## 2017-11-14 LAB — PROTIME-INR
INR: 1.18
Prothrombin Time: 14.9 seconds (ref 11.4–15.2)

## 2017-11-14 LAB — BASIC METABOLIC PANEL
Anion gap: 13 (ref 5–15)
BUN: 80 mg/dL — AB (ref 6–20)
CHLORIDE: 114 mmol/L — AB (ref 101–111)
CO2: 13 mmol/L — ABNORMAL LOW (ref 22–32)
CREATININE: 10.53 mg/dL — AB (ref 0.44–1.00)
Calcium: 6.8 mg/dL — ABNORMAL LOW (ref 8.9–10.3)
GFR calc Af Amer: 4 mL/min — ABNORMAL LOW (ref >60)
GFR calc non Af Amer: 4 mL/min — ABNORMAL LOW (ref >60)
Glucose, Bld: 58 mg/dL — ABNORMAL LOW (ref 65–99)
Potassium: 4.5 mmol/L (ref 3.5–5.1)
SODIUM: 140 mmol/L (ref 135–145)

## 2017-11-14 LAB — CBG MONITORING, ED: Glucose-Capillary: 65 mg/dL (ref 65–99)

## 2017-11-14 LAB — CBC
HCT: 21.2 % — ABNORMAL LOW (ref 36.0–46.0)
Hemoglobin: 6.7 g/dL — CL (ref 12.0–15.0)
MCH: 28.4 pg (ref 26.0–34.0)
MCHC: 31.6 g/dL (ref 30.0–36.0)
MCV: 89.8 fL (ref 78.0–100.0)
PLATELETS: 290 10*3/uL (ref 150–400)
RBC: 2.36 MIL/uL — ABNORMAL LOW (ref 3.87–5.11)
RDW: 14.5 % (ref 11.5–15.5)
WBC: 14.4 10*3/uL — ABNORMAL HIGH (ref 4.0–10.5)

## 2017-11-14 LAB — I-STAT TROPONIN, ED: Troponin i, poc: 0 ng/mL (ref 0.00–0.08)

## 2017-11-14 LAB — POC OCCULT BLOOD, ED: FECAL OCCULT BLD: NEGATIVE

## 2017-11-14 LAB — I-STAT BETA HCG BLOOD, ED (MC, WL, AP ONLY): I-stat hCG, quantitative: 8 m[IU]/mL — ABNORMAL HIGH (ref <5)

## 2017-11-14 LAB — ABO/RH: ABO/RH(D): O POS

## 2017-11-14 LAB — PREPARE RBC (CROSSMATCH)

## 2017-11-14 MED ORDER — DEXTROSE 5 % IV SOLN
1.0000 g | Freq: Once | INTRAVENOUS | Status: AC
  Administered 2017-11-14: 1 g via INTRAVENOUS
  Filled 2017-11-14: qty 10

## 2017-11-14 MED ORDER — ATORVASTATIN CALCIUM 40 MG PO TABS
40.0000 mg | ORAL_TABLET | Freq: Every evening | ORAL | Status: DC
  Administered 2017-11-14 – 2017-11-22 (×9): 40 mg via ORAL
  Filled 2017-11-14 (×10): qty 1

## 2017-11-14 MED ORDER — INSULIN ASPART 100 UNIT/ML ~~LOC~~ SOLN
0.0000 [IU] | Freq: Three times a day (TID) | SUBCUTANEOUS | Status: DC
  Administered 2017-11-15 – 2017-11-16 (×2): 2 [IU] via SUBCUTANEOUS
  Administered 2017-11-18 (×2): 3 [IU] via SUBCUTANEOUS
  Administered 2017-11-19: 1 [IU] via SUBCUTANEOUS
  Administered 2017-11-19 – 2017-11-20 (×2): 3 [IU] via SUBCUTANEOUS
  Administered 2017-11-22: 2 [IU] via SUBCUTANEOUS

## 2017-11-14 MED ORDER — ENOXAPARIN SODIUM 30 MG/0.3ML ~~LOC~~ SOLN
30.0000 mg | Freq: Every day | SUBCUTANEOUS | Status: DC
  Administered 2017-11-14 – 2017-11-22 (×9): 30 mg via SUBCUTANEOUS
  Filled 2017-11-14 (×9): qty 0.3

## 2017-11-14 MED ORDER — ACETAMINOPHEN 500 MG PO TABS
500.0000 mg | ORAL_TABLET | Freq: Four times a day (QID) | ORAL | Status: DC | PRN
  Administered 2017-11-23: 1000 mg via ORAL
  Filled 2017-11-14: qty 2
  Filled 2017-11-14: qty 1

## 2017-11-14 MED ORDER — DEXTROSE 5 % IV SOLN
500.0000 mg | INTRAVENOUS | Status: DC
  Administered 2017-11-15 – 2017-11-16 (×2): 500 mg via INTRAVENOUS
  Filled 2017-11-14 (×2): qty 500

## 2017-11-14 MED ORDER — AMLODIPINE BESYLATE 5 MG PO TABS
7.5000 mg | ORAL_TABLET | Freq: Every day | ORAL | Status: DC
  Administered 2017-11-14 – 2017-11-15 (×2): 7.5 mg via ORAL
  Filled 2017-11-14: qty 1
  Filled 2017-11-14: qty 2

## 2017-11-14 MED ORDER — SODIUM CHLORIDE 0.9 % IV SOLN: 250.0000 mL | INTRAVENOUS | Status: DC | PRN

## 2017-11-14 MED ORDER — SODIUM CHLORIDE 0.9% FLUSH: 3.0000 mL | INTRAVENOUS | Status: DC | PRN

## 2017-11-14 MED ORDER — DEXTROSE 5 % IV SOLN
500.0000 mg | Freq: Once | INTRAVENOUS | Status: AC
  Administered 2017-11-14: 500 mg via INTRAVENOUS
  Filled 2017-11-14: qty 500

## 2017-11-14 MED ORDER — MORPHINE SULFATE (PF) 4 MG/ML IV SOLN
4.0000 mg | Freq: Once | INTRAVENOUS | Status: DC
  Filled 2017-11-14: qty 1

## 2017-11-14 MED ORDER — INSULIN ASPART 100 UNIT/ML ~~LOC~~ SOLN: 0.0000 [IU] | Freq: Every day | SUBCUTANEOUS | Status: DC

## 2017-11-14 MED ORDER — DEXTROSE 5 % IV SOLN
1.0000 g | INTRAVENOUS | Status: DC
  Administered 2017-11-15 – 2017-11-18 (×4): 1 g via INTRAVENOUS
  Filled 2017-11-14 (×4): qty 10

## 2017-11-14 MED ORDER — SODIUM CHLORIDE 0.9% FLUSH
3.0000 mL | Freq: Two times a day (BID) | INTRAVENOUS | Status: DC
  Administered 2017-11-15 – 2017-11-22 (×15): 3 mL via INTRAVENOUS

## 2017-11-14 NOTE — ED Triage Notes (Signed)
Pt in c/o SOB onset x 3 days with L sided back pain, pt c/o CP, pt denies n/v/d, pt c/o weakness, pt blind, A&O x4, interpretor service used in triage

## 2017-11-14 NOTE — ED Notes (Signed)
Admitting doctor called and stated that he spoke with nephrology and they recommended only transfusing 1 unit of blood.

## 2017-11-14 NOTE — ED Notes (Signed)
Charge RN aware of need for room d/t hgb

## 2017-11-14 NOTE — ED Notes (Signed)
Two unsuccessful IV attempts.

## 2017-11-14 NOTE — ED Notes (Signed)
Patient given orange juice to drink to bring CBG up.

## 2017-11-14 NOTE — ED Provider Notes (Signed)
[facility] EMERGENCY DEPARTMENT Provider Note   CSN: [id number] Arrival date & time: 11/14/17  1400     History   Chief Complaint Chief Complaint  Patient presents with  . Shortness of Breath    HPI [name] is a 56 y.o. female.  HPI  Patient's family member is Optometrist and also speaking Spanish for some direct conversation with the patient. Patient has known kidney failure and has been refusing to start dialysis.  Over the past 4 days she has been having pain in her back and chest.  She reports the pain started on her left side around her shoulder.  She has been getting increasingly short of breath as well.  Patient denies cough or fever.  She denies she is having abdominal pain, vomiting or diarrhea.  She reports however she has pain that goes all the way from her thoracic and mid back down to her upper legs.  Family members have noticed she has been more short of breath for several days.  She has been refusing to come to the hospital.  Patient denies any swelling in her legs or pain in her calves. Past Medical History:  Diagnosis Date  . Ascites 10/17/2011   See ovarian mass  . Cataract 02/24/2013   Right remains--No benefit for surgery due to retinal traction ischemia, atrophy, holes as per Dr. Ria Comment, [facility]  . Chronic pain syndrome 2013   neck, back, side  . Constipation   . Controlled type 2 diabetes mellitus with diabetic retinopathy ([place])   . Diabetic nephropathy ([place])    Creatinine 1.38 09/2012  . Hypertension   . Ovarian mass 10/17/2011   27.5 cm endometrioma replacing left ovary with 12 L brown ascites.  Pt. underwent TAH, BSO with removal of the endometrioma as well and removal of the ascites.  No findings of malignancy  . Proliferative diabetic retinopathy ([place]) 09/2012   Followed by Dr. Gearlean Alf, Retinal Specialist at [facility] and Dr. Jolyn Nap, [place], Cataract specialist    Patient Active Problem List    Diagnosis Date Noted  . 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 Hypertension   . Chronic pain syndrome   . DM (diabetes mellitus), type 2 with ophthalmic complications ([place]) 64/33/2951  . Essential hypertension 10/06/2015  . Diabetic nephropathy associated with type 2 diabetes mellitus ([place]) 10/06/2015  . LUQ pain 10/06/2015  . Chronic right-sided low back pain 10/06/2015  . Proliferative diabetic retinopathy ([place]) 09/17/2012  . Mass of ovary 11/29/2011    Past Surgical History:  Procedure Laterality Date  . ABDOMINAL HYSTERECTOMY  10/17/2011   BSO for large left ovarian endometrioma  . EXPLORATORY LAPAROTOMY  2012   12 L of ascites drainage  . EYE SURGERY Left 2015   Laser treatment to left eye  . EYE SURGERY Left 10/2014   Cataract extraction, left eye  . TUBAL LIGATION  1986    OB History    No data available       Home Medications    Prior to Admission medications   Medication Sig Start Date End Date Taking? Authorizing Provider  acetaminophen (TYLENOL) 500 MG tablet Take 500-1,000 mg by mouth every 6 (six) hours as needed for mild pain or headache.   Yes [provider]  amLODipine (NORVASC) 5 MG tablet 1 1/2 tabs by mouth daily Patient taking differently: Take 7.5 mg by mouth daily.  09/06/17  Yes Mack Hook, MD  atorvastatin (LIPITOR) 40 MG tablet  1 tab by mouth with evening meal Patient taking differently: Take 40 mg by mouth every evening. 1 tab by mouth with evening meal 07/28/17  Yes Mack Hook, MD  glipiZIDE (GLUCOTROL XL) 5 MG 24 hr tablet TAKE ONE TABLET BY MOUTH ONCE DAILY WITH BREAKFAST Patient taking differently: Take 5 mg by mouth once a day 07/02/17  Yes Mack Hook, MD  metaxalone (SKELAXIN) 400 MG tablet TAKE ONE TABLET BY MOUTH AT BEDTIME AS NEEDED FOR NECK AND BACK PAIN Patient not taking: Reported on 11/14/2017 10/24/16   Mack Hook, MD    Family History Family History  Problem Relation Age of Onset  .  Heart attack Mother   . Cerebral palsy Son        With developmental delay    Social History Social History   Tobacco Use  . Smoking status: Never Smoker  . Smokeless tobacco: Never Used  Substance Use Topics  . Alcohol use: No    Alcohol/week: 0.0 oz  . Drug use: No     Allergies   Trazodone   Review of Systems Review of Systems 10 Systems reviewed and are negative for acute change except as noted in the HPI.   Physical Exam Updated Vital Signs BP (!) 145/64   Pulse (!) 103   Temp 99.4 F (37.4 C) (Rectal)   Resp (!) 23   SpO2 95%   Physical Exam  Constitutional: She is oriented to person, place, and time.  Patient is ill in appearance.  She is alert and nontoxic.  She has tachypnea but breathing is not severely labored.  Patient is pale in appearance.  HENT:  Head: Normocephalic and atraumatic.  Mouth/Throat: Oropharynx is clear and moist.  Eyes:  Conjunctiva pale  Neck: Neck supple.  Cardiovascular: Normal rate, regular rhythm, normal heart sounds and intact distal pulses.  Pulmonary/Chest:  Breath sounds sound transmitted right upper lobe.  Decreased at right base.  Grossly normal left breath sounds  Abdominal:  Abdomen seems slightly distended.  Patient has abdominal wall hernias.  She is denying palpation to tenderness  Genitourinary:  Genitourinary Comments: Rectal soft brown yellow stool in the vault.  No melena.  Musculoskeletal: Normal range of motion. She exhibits no edema or tenderness.  Patient does not have any peripheral edema.  Calves are soft.  Skin condition is good.  Legs are normal in appearance except some slight muscular atrophy of the thighs.  No areas of swelling or erythema.  Neurological: She is alert and oriented to person, place, and time. No cranial nerve deficit. She exhibits normal muscle tone. Coordination normal.  Skin: Skin is warm. There is pallor.     ED Treatments / Results  Labs (all labs ordered are listed, but only  abnormal results are displayed) Labs Reviewed  BASIC METABOLIC PANEL - Abnormal; Notable for the following components:      Result Value   Chloride 114 (*)    CO2 13 (*)    Glucose, Bld 58 (*)    BUN 80 (*)    Creatinine, Ser 10.53 (*)    Calcium 6.8 (*)    GFR calc non Af Amer 4 (*)    GFR calc Af Amer 4 (*)    All other components within normal limits  CBC - Abnormal; Notable for the following components:   WBC 14.4 (*)    RBC 2.36 (*)    Hemoglobin 6.7 (*)    HCT 21.2 (*)    All other components within  normal limits  I-STAT BETA HCG BLOOD, ED ([facility], [facility], [facility] ONLY) - Abnormal; Notable for the following components:   I-stat hCG, quantitative 8.0 (*)    All other components within normal limits  CULTURE, BLOOD (ROUTINE X 2)  CULTURE, BLOOD (ROUTINE X 2)  URINE CULTURE  PROTIME-INR  PROTIME-INR  URINALYSIS, ROUTINE W REFLEX MICROSCOPIC  I-STAT TROPONIN, ED  POC OCCULT BLOOD, ED  PREPARE RBC (CROSSMATCH)  TYPE AND SCREEN  ABO/RH    EKG  EKG Interpretation  Date/Time:  Wednesday November 14 2017 14:05:33 EST Ventricular Rate:  96 PR Interval:  150 QRS Duration: 72 QT Interval:  366 QTC Calculation: 462 R Axis:   -14 Text Interpretation:  Normal sinus rhythm Nonspecific ST and T wave abnormality Abnormal ECG agree. no STEMI. No sig change from old Confirmed by Charlesetta Shanks [phone]) on 11/14/2017 5:41:00 PM       Radiology Dg Chest 2 View  Result Date: 11/14/2017 CLINICAL DATA:  2 day hx. Sob, chest upper right chest pain radiating through to right upper posterior chest. Bilat. Arm pain. Hx DM, HTN EXAM: CHEST  2 VIEW COMPARISON:  None. FINDINGS: Heart is mildly enlarged. No pulmonary edema. There patchy airspace filling opacities within the right upper lobe, right lower lobe, and left upper lobe. There pleural changes bilaterally, consistent small effusions or thickening. Degenerative changes are seen in the shoulders bilaterally. There is atherosclerotic calcification  of the abdominal aorta. IMPRESSION: 1. Multifocal pulmonary infiltrates. 2. Cardiomegaly without pulmonary edema. Electronically Signed   By: Nolon Nations M.D.   On: 11/14/2017 15:25    Procedures Procedures (including critical care time)  Medications Ordered in ED Medications  cefTRIAXone (ROCEPHIN) 1 g in dextrose 5 % 50 mL IVPB (not administered)  azithromycin (ZITHROMAX) 500 mg in dextrose 5 % 250 mL IVPB (not administered)  morphine 4 MG/ML injection 4 mg (not administered)     Initial Impression / Assessment and Plan / ED Course  I have reviewed the triage vital signs and the nursing notes.  Pertinent labs & imaging results that were available during my care of the patient were reviewed by me and considered in my medical decision making (see chart for details).     Consult: Reviewed with Dr. Jonnie Finner for admission Final Clinical Impressions(s) / ED Diagnoses   Final diagnoses:  Community acquired pneumonia of left lung, unspecified part of lung  ESRD needing dialysis ([place])  Symptomatic anemia  Patient presents with pre-existing complex medical history.  She has kidney failure but has refused dialysis.  By reviewing notes, it appears that the patient's objection to dialysis has a lot to do with fear of complications and worsening illness.  Patient is gotten acutely ill over the past 4 days developing left-sided thoracic chest pain and back pain.  Chest x-ray shows pneumonia.  Patient is significantly anemic.  Rectal exam is for brown stool.  No melena.  Anemia may be chronic renal disease but is significantly changed from a year ago.  A year ago she was at 10 mg/dL and today 6 mg/dL.  Transfusion ordered.  Patient is symptomatic with dyspnea and weakness.  Blood pressure and heart rate are stable.  I also have some concern for neoplastic disease.  Her daughter discussed a "mass" that had been identified but the patient and the daughter reports that she was never diagnosed with  cancer.  Patient's persistent lower back pain that goes into her buttocks and legs and mildly elevated quantitative hCG, will proceed  with CT chest abdomen to look for any suspicious masses.  ED Discharge Orders    None       Charlesetta Shanks, MD 11/14/17 1910

## 2017-11-14 NOTE — Consult Note (Addendum)
Reason for Consult: CKD V/ESRD Referring Physician:  Dr. Charlesetta Shanks PCP: Mack Hook, MD     Chief Complaint: Dyspnea  Assessment/Plan: 1. Dyspnea - Related to PNA with infiltrates -> started on Abx for CAP. No edema on CXR. 2. CKDV/ESRD - She had been refusing dialysis which is why there is no access present at the current time. I counseled her and the son who was bedside utilizing the services of the ED interpreter. She is followed at [facility] by Dr. Justin Mend and has been to dialysis classes. - Certainly at minimum CKDV and likely now ESRD; thru the interpreter the pt is now amenable to starting dialysis. She complained of having to start with a cath but understands that if a fistula had been placed months ago this many not have been necessary as the initial mode of dialysis. - No absolute indication but if no improvement in renal function or acidosis over the next few days will have a low tolerance to initiating HD. - Of note she is returning back to [place] permanently early December; son understands that when she needs she will need very close follow up for dialysis needs. 3. PNA - started on std CAP Abx. 4. LBP which has always been present since an endometrioma was removed in 2012. 5. DM 6. HTN - controlled   HPI: [name] is an 56 y.o. female CKDV nearing dialysis who had been refusing dialysis but has been to dialysis classes followed by [facility] Dr. Justin Mend. She is p/w dyspnea, cough but denies myalgias, rigors, fevers. She has had right sided chest pain not associated with exertion. She does have some anorexia and anorexia but denies any vomiting. There were multiple pulmonary infiltrrates present and cardiomegaly without pulmonary edema on CXR.   ROS Pertinent items are noted in HPI. + chronic lower back pain since abdominal surgery but denies sciatica or retention or lower ext weakness.  Chemistry and CBC: Creatinine, Ser  Date/Time Value Ref Range Status  11/14/2017  02:11 PM 10.53 (H) 0.44 - 1.00 mg/dL Final  11/05/2017 09:08 AM 9.23 (H) 0.57 - 1.00 mg/dL Final  09/06/2017 05:21 PM 7.49 (H) 0.57 - 1.00 mg/dL Final  07/12/2017 09:00 AM 6.82 (H) 0.57 - 1.00 mg/dL Final  08/11/2016 11:05 AM 4.35 (H) 0.57 - 1.00 mg/dL Final  04/27/2016 01:10 PM 3.21 (H) 0.57 - 1.00 mg/dL Final  01/21/2016 02:39 PM 2.63 (H) 0.57 - 1.00 mg/dL Final  10/06/2015 05:57 PM 2.39 (H) 0.57 - 1.00 mg/dL Final  10/18/2011 01:15 PM 1.28 (H) 0.50 - 1.10 mg/dL Final  10/18/2011 04:35 AM 1.23 (H) 0.50 - 1.10 mg/dL Final  10/13/2011 01:30 PM 1.61 (H) 0.50 - 1.10 mg/dL Final  10/12/2011 07:20 AM 1.69 (H) 0.50 - 1.10 mg/dL Final  10/11/2011 06:00 AM 1.94 (H) 0.50 - 1.10 mg/dL Final  10/10/2011 07:00 AM 2.62 (H) 0.50 - 1.10 mg/dL Final  10/09/2011 09:50 AM 2.99 (H) 0.50 - 1.10 mg/dL Final   Recent Labs  Lab 11/14/17 1411  NA 140  K 4.5  CL 114*  CO2 13*  GLUCOSE 58*  BUN 80*  CREATININE 10.53*  CALCIUM 6.8*   Recent Labs  Lab 11/14/17 1411  WBC 14.4*  HGB 6.7*  HCT 21.2*  MCV 89.8  PLT 290   Liver Function Tests: No results for input(s): AST, ALT, ALKPHOS, BILITOT, PROT, ALBUMIN in the last 168 hours. No results for input(s): LIPASE, AMYLASE in the last 168 hours. No results for input(s): AMMONIA in the last  168 hours. Cardiac Enzymes: No results for input(s): CKTOTAL, CKMB, CKMBINDEX, TROPONINI in the last 168 hours. Iron Studies: No results for input(s): IRON, TIBC, TRANSFERRIN, FERRITIN in the last 72 hours. PT/INR: _0 (inr:5)  Xrays/Other Studies: ) Results for orders placed or performed during the hospital encounter of 11/14/17 (from the past 48 hour(s))  Basic metabolic panel     Status: Abnormal   Collection Time: 11/14/17  2:11 PM  Result Value Ref Range   Sodium 140 135 - 145 mmol/L   Potassium 4.5 3.5 - 5.1 mmol/L   Chloride 114 (H) 101 - 111 mmol/L   CO2 13 (L) 22 - 32 mmol/L   Glucose, Bld 58 (L) 65 - 99 mg/dL   BUN 80 (H) 6 - 20 mg/dL    Creatinine, Ser 10.53 (H) 0.44 - 1.00 mg/dL   Calcium 6.8 (L) 8.9 - 10.3 mg/dL   GFR calc non Af Amer 4 (L) >60 mL/min   GFR calc Af Amer 4 (L) >60 mL/min    Comment: (NOTE) The eGFR has been calculated using the CKD EPI equation. This calculation has not been validated in all clinical situations. eGFR's persistently <60 mL/min signify possible Chronic Kidney Disease.    Anion gap 13 5 - 15  CBC     Status: Abnormal   Collection Time: 11/14/17  2:11 PM  Result Value Ref Range   WBC 14.4 (H) 4.0 - 10.5 K/uL   RBC 2.36 (L) 3.87 - 5.11 MIL/uL   Hemoglobin 6.7 (LL) 12.0 - 15.0 g/dL    Comment: SPECIMEN CHECKED FOR CLOTS REPEATED TO VERIFY CRITICAL RESULT CALLED TO, READ BACK BY AND VERIFIED WITH: C. CHRISTO RN AT [id number] ON [id number] BY D. Robinette Haines.    HCT 21.2 (L) 36.0 - 46.0 %   MCV 89.8 78.0 - 100.0 fL   MCH 28.4 26.0 - 34.0 pg   MCHC 31.6 30.0 - 36.0 g/dL   RDW 14.5 11.5 - 15.5 %   Platelets 290 150 - 400 K/uL  I-Stat beta hCG blood, ED     Status: Abnormal   Collection Time: 11/14/17  2:32 PM  Result Value Ref Range   I-stat hCG, quantitative 8.0 (H) <5 mIU/mL   Comment 3            Comment:   GEST. AGE      CONC.  (mIU/mL)   <=1 WEEK        5 - 50     2 WEEKS       50 - 500     3 WEEKS       100 - 10,000     4 WEEKS     1,000 - 30,000        FEMALE AND NON-PREGNANT FEMALE:     LESS THAN 5 mIU/mL   I-stat troponin, ED     Status: None   Collection Time: 11/14/17  2:33 PM  Result Value Ref Range   Troponin i, poc 0.00 0.00 - 0.08 ng/mL   Comment 3            Comment: Due to the release kinetics of cTnI, a negative result within the first hours of the onset of symptoms does not rule out myocardial infarction with certainty. If myocardial infarction is still suspected, repeat the test at appropriate intervals.   Prepare RBC     Status: None   Collection Time: 11/14/17  5:11 PM  Result Value Ref Range   Order Confirmation ORDER PROCESSED  BY BLOOD BANK   Type and screen  White     Status: None (Preliminary result)   Collection Time: 11/14/17  5:11 PM  Result Value Ref Range   ABO/RH(D) O POS    Antibody Screen NEG    Sample Expiration 11/17/2017    Unit Number [id number]    Blood Component Type RED CELLS,LR    Unit division 00    Status of Unit ALLOCATED    Transfusion Status OK TO TRANSFUSE    Crossmatch Result Compatible    Unit Number [id number]    Blood Component Type RBC LR PHER2    Unit division 00    Status of Unit ISSUED    Transfusion Status OK TO TRANSFUSE    Crossmatch Result Compatible   ABO/Rh     Status: None   Collection Time: 11/14/17  5:11 PM  Result Value Ref Range   ABO/RH(D) O POS   Protime-INR     Status: None   Collection Time: 11/14/17  5:43 PM  Result Value Ref Range   Prothrombin Time 14.9 11.4 - 15.2 seconds   INR 1.18   POC occult blood, ED Provider will collect     Status: None   Collection Time: 11/14/17  6:45 PM  Result Value Ref Range   Fecal Occult Bld NEGATIVE NEGATIVE  Prepare RBC     Status: None   Collection Time: 11/14/17  7:24 PM  Result Value Ref Range   Order Confirmation BB SAMPLE OR UNITS ALREADY AVAILABLE   CBG monitoring, ED     Status: None   Collection Time: 11/14/17 10:47 PM  Result Value Ref Range   Glucose-Capillary 65 65 - 99 mg/dL   Ct Abdomen Pelvis Wo Contrast  Result Date: 11/14/2017 CLINICAL DATA:  56 y/o  F; chest pain and left leg pain for 3 days. EXAM: CT CHEST, ABDOMEN AND PELVIS WITHOUT CONTRAST TECHNIQUE: Multidetector CT imaging of the chest, abdomen and pelvis was performed following the standard protocol without IV contrast. COMPARISON:  None. FINDINGS: CT CHEST FINDINGS Cardiovascular: No significant vascular findings. Normal heart size. No pericardial effusion. Normal caliber thoracic aorta. Moderate calcific atherosclerosis. Mild coronary artery calcification. Mediastinum/Nodes: No enlarged mediastinal, hilar, or axillary lymph nodes. Thyroid  gland, trachea, and esophagus demonstrate no significant findings. Lungs/Pleura: Dense consolidation within the right lower lobe and patchy foci of consolidation scattered throughout the lungs bilaterally. Moderate right and small left pleural effusions. No pneumothorax. Musculoskeletal: No chest wall mass or suspicious bone lesions identified. CT ABDOMEN PELVIS FINDINGS Hepatobiliary: No focal liver abnormality is seen. No gallstones, gallbladder wall thickening, or biliary dilatation. Pancreas: Unremarkable. No pancreatic ductal dilatation or surrounding inflammatory changes. Spleen: Normal in size without focal abnormality. Adrenals/Urinary Tract: Normal adrenal glands. Severe right kidney atrophy. No hydronephrosis. No focal left kidney lesion identified. Normal bladder. Stomach/Bowel: Stomach is within normal limits. Appendix appears normal. No evidence of bowel wall thickening, distention, or inflammatory changes. Vascular/Lymphatic: Aortic atherosclerosis. No enlarged abdominal or pelvic lymph nodes. Reproductive: Status post hysterectomy. No adnexal masses. Other: Diastases rectus and several small hernias in the umbilical region containing fat. Musculoskeletal: No acute or significant osseous findings. IMPRESSION: 1. Multifocal pneumonia with dense consolidation in the right upper lobe along the major fissure. 2. Moderate right and small left pleural effusion. 3. Mild coronary artery calcific atherosclerosis. Aortic atherosclerosis. 4. Severe right kidney atrophy. 5. Diastases rectus and several small hernias containing fat in the umbilical region. Electronically Signed   By: Mia Creek  [place]Stratton M.D.   On: 11/14/2017 19:28   Dg Chest 2 View  Result Date: 11/14/2017 CLINICAL DATA:  2 day hx. Sob, chest upper right chest pain radiating through to right upper posterior chest. Bilat. Arm pain. Hx DM, HTN EXAM: CHEST  2 VIEW COMPARISON:  None. FINDINGS: Heart is mildly enlarged. No pulmonary edema.  There patchy airspace filling opacities within the right upper lobe, right lower lobe, and left upper lobe. There pleural changes bilaterally, consistent small effusions or thickening. Degenerative changes are seen in the shoulders bilaterally. There is atherosclerotic calcification of the abdominal aorta. IMPRESSION: 1. Multifocal pulmonary infiltrates. 2. Cardiomegaly without pulmonary edema. Electronically Signed   By: Nolon Nations M.D.   On: 11/14/2017 15:25   Ct Chest Wo Contrast  Result Date: 11/14/2017 CLINICAL DATA:  56 y/o  F; chest pain and left leg pain for 3 days. EXAM: CT CHEST, ABDOMEN AND PELVIS WITHOUT CONTRAST TECHNIQUE: Multidetector CT imaging of the chest, abdomen and pelvis was performed following the standard protocol without IV contrast. COMPARISON:  None. FINDINGS: CT CHEST FINDINGS Cardiovascular: No significant vascular findings. Normal heart size. No pericardial effusion. Normal caliber thoracic aorta. Moderate calcific atherosclerosis. Mild coronary artery calcification. Mediastinum/Nodes: No enlarged mediastinal, hilar, or axillary lymph nodes. Thyroid gland, trachea, and esophagus demonstrate no significant findings. Lungs/Pleura: Dense consolidation within the right lower lobe and patchy foci of consolidation scattered throughout the lungs bilaterally. Moderate right and small left pleural effusions. No pneumothorax. Musculoskeletal: No chest wall mass or suspicious bone lesions identified. CT ABDOMEN PELVIS FINDINGS Hepatobiliary: No focal liver abnormality is seen. No gallstones, gallbladder wall thickening, or biliary dilatation. Pancreas: Unremarkable. No pancreatic ductal dilatation or surrounding inflammatory changes. Spleen: Normal in size without focal abnormality. Adrenals/Urinary Tract: Normal adrenal glands. Severe right kidney atrophy. No hydronephrosis. No focal left kidney lesion identified. Normal bladder. Stomach/Bowel: Stomach is within normal limits. Appendix  appears normal. No evidence of bowel wall thickening, distention, or inflammatory changes. Vascular/Lymphatic: Aortic atherosclerosis. No enlarged abdominal or pelvic lymph nodes. Reproductive: Status post hysterectomy. No adnexal masses. Other: Diastases rectus and several small hernias in the umbilical region containing fat. Musculoskeletal: No acute or significant osseous findings. IMPRESSION: 1. Multifocal pneumonia with dense consolidation in the right upper lobe along the major fissure. 2. Moderate right and small left pleural effusion. 3. Mild coronary artery calcific atherosclerosis. Aortic atherosclerosis. 4. Severe right kidney atrophy. 5. Diastases rectus and several small hernias containing fat in the umbilical region. Electronically Signed   By: Kristine Garbe M.D.   On: 11/14/2017 19:28    PMH:   Past Medical History:  Diagnosis Date  . Ascites 10/17/2011   See ovarian mass  . Cataract 02/24/2013   Right remains--No benefit for surgery due to retinal traction ischemia, atrophy, holes as per Dr. Ria Comment, [facility]  . Chronic pain syndrome 2013   neck, back, side  . Constipation   . 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 Diabetic nephropathy ([place])    Creatinine 1.38 09/2012  . Hypertension   . Ovarian mass 10/17/2011   27.5 cm endometrioma replacing left ovary with 12 L brown ascites.  Pt. underwent TAH, BSO with removal of the endometrioma as well and removal of the ascites.  No findings of malignancy  . Proliferative diabetic retinopathy ([place]) 09/2012   Followed by Dr. Gearlean Alf, Retinal Specialist at [facility] and Dr. Jolyn Nap, [facility], Cataract specialist    PSH:   Past Surgical  History:  Procedure Laterality Date  . ABDOMINAL HYSTERECTOMY  10/17/2011   BSO for large left ovarian endometrioma  . EXPLORATORY LAPAROTOMY  2012   12 L of ascites drainage  . EYE SURGERY Left 2015   Laser treatment to left eye  . EYE  SURGERY Left 10/2014   Cataract extraction, left eye  . TUBAL LIGATION  1986    Allergies:  Allergies  Allergen Reactions  . Trazodone Anaphylaxis    Daughter doubts this, but [facility] had this entered in from 2015    Medications:   Prior to Admission medications   Medication Sig Start Date End Date Taking? Authorizing Provider  acetaminophen (TYLENOL) 500 MG tablet Take 500-1,000 mg by mouth every 6 (six) hours as needed for mild pain or headache.   Yes [provider]  amLODipine (NORVASC) 5 MG tablet 1 1/2 tabs by mouth daily Patient taking differently: Take 7.5 mg by mouth daily.  09/06/17  Yes Mack Hook, MD  atorvastatin (LIPITOR) 40 MG tablet 1 tab by mouth with evening meal Patient taking differently: Take 40 mg by mouth every evening. 1 tab by mouth with evening meal 07/28/17  Yes Mack Hook, MD  glipiZIDE (GLUCOTROL XL) 5 MG 24 hr tablet TAKE ONE TABLET BY MOUTH ONCE DAILY WITH BREAKFAST Patient taking differently: Take 5 mg by mouth once a day 07/02/17  Yes Mack Hook, MD  metaxalone (SKELAXIN) 400 MG tablet TAKE ONE TABLET BY MOUTH AT BEDTIME AS NEEDED FOR NECK AND BACK PAIN Patient not taking: Reported on 11/14/2017 10/24/16   Mack Hook, MD    Discontinued Meds:   Medications Discontinued During This Encounter  Medication Reason  . morphine 4 MG/ML injection 4 mg     Social History:  reports that  has never smoked. she has never used smokeless tobacco. She reports that she does not drink alcohol or use drugs.  Family History:   Family History  Problem Relation Age of Onset  . Heart attack Mother   . Cerebral palsy Son        With developmental delay    Blood pressure (!) 148/75, pulse (!) 103, temperature 99.5 F (37.5 C), temperature source Oral, resp. rate (!) 23, SpO2 96 %. General appearance: alert, cooperative and appears stated age Head: Normocephalic, without obvious abnormality, atraumatic Eyes: Blind Neck: no  adenopathy, no carotid bruit, no JVD, supple, symmetrical, trachea midline and thyroid not enlarged, symmetric, no tenderness/mass/nodules Back: symmetric, no curvature. ROM normal. No CVA tenderness. Resp: clear to auscultation bilaterally Chest wall: no tenderness Cardio: regular rate and rhythm, S1, S2 normal, no murmur, click, rub or gallop GI: soft, non-tender; bowel sounds normal; no masses,  no organomegaly Extremities: extremities normal, atraumatic, no cyanosis or edema Pulses: 2+ and symmetric Skin: Skin color, texture, turgor normal. No rashes or lesions Lymph nodes: Cervical, supraclavicular, and axillary nodes normal. Neurologic: Mental status: Alert, oriented, thought content appropriate       Dwana Melena, MD 11/14/2017, 11:16 PM

## 2017-11-14 NOTE — H&P (Signed)
[facility] History and Physical  [name] WTU:[id number] DOB: [birth year] DOA: 11/14/2017  Referring physician: Dr Johnney Killian PCP: Mack Hook, MD   Chief Complaint: SOB  HPI: [name] is a 56 y.o. female with long hx of DM 2 > 20 yrs, HTN and CKD f/b [facility] (Dr Justin Mend).  She presents to ED w/ complaint of pain in her back and chest, also SOB. In ED temp was 99.4, wBC 14kg , BP 140 / 60.  HR 102 and RR 26.  CXR showed multifocal pulmonary infiltrates and cardiomegaly without pulmonary edema.  CT chest showed multifocal dense infiltrates R > L w/ R sided pleural effusion. Pt rec'd IV ROcephin in ED.  Creat here is 10.5 - in Aug 2017 creat was 4.3, up to 6.8 in July '18, 7.5 in Sept '18 and 9.2 on Nov 05, 2017.  Reportedly pt has been told she'll need dialysis but has been refusing.  Asked to see for admission.    Hx gained from daughter and from phone interpreter.  Pt has had nausea and low energy, low appetite, no vomiting, no SOB.  +CP and back pain.  No fever.   Back in 2012 pt had a tumor removed here from her abdomen, turned out to be an endometrioma which was benign.    Pt sees Dr Justin Mend at [facility], she says she sees him every 3 months. She has been to dialysis classes.     ROS  no joint pain   no HA  no blurry vision  no rash  no diarrhea  no nausea/ vomiting    Past Medical History  Past Medical History:  Diagnosis Date  . Ascites 10/17/2011   See ovarian mass  . Cataract 02/24/2013   Right remains--No benefit for surgery due to retinal traction ischemia, atrophy, holes as per Dr. Ria Comment, [facility]  . Chronic pain syndrome 2013   neck, back, side  . Constipation   . 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 Diabetic nephropathy ([place])    Creatinine 1.38 09/2012  . Hypertension   . Ovarian mass 10/17/2011   27.5 cm endometrioma replacing left ovary with 12 L brown ascites.  Pt. underwent TAH, BSO with  removal of the endometrioma as well and removal of the ascites.  No findings of malignancy  . Proliferative diabetic retinopathy ([place]) 09/2012   Followed by Dr. Gearlean Alf, Retinal Specialist at [facility] and Dr. Jolyn Nap, [facility], Cataract specialist   Past Surgical History  Past Surgical History:  Procedure Laterality Date  . ABDOMINAL HYSTERECTOMY  10/17/2011   BSO for large left ovarian endometrioma  . EXPLORATORY LAPAROTOMY  2012   12 L of ascites drainage  . EYE SURGERY Left 2015   Laser treatment to left eye  . EYE SURGERY Left 10/2014   Cataract extraction, left eye  . TUBAL LIGATION  1986   Family History  Family History  Problem Relation Age of Onset  . Heart attack Mother   . Cerebral palsy Son        With developmental delay   Social History  reports that  has never smoked. she has never used smokeless tobacco. She reports that she does not drink alcohol or use drugs. Allergies  Allergies  Allergen Reactions  . Trazodone Anaphylaxis    Daughter doubts this, but [facility] had this entered in from 2015   Home medications Prior to Admission medications   Medication Sig  Start Date End Date Taking? Authorizing Provider  acetaminophen (TYLENOL) 500 MG tablet Take 500-1,000 mg by mouth every 6 (six) hours as needed for mild pain or headache.   Yes [provider]  amLODipine (NORVASC) 5 MG tablet 1 1/2 tabs by mouth daily Patient taking differently: Take 7.5 mg by mouth daily.  09/06/17  Yes Mack Hook, MD  atorvastatin (LIPITOR) 40 MG tablet 1 tab by mouth with evening meal Patient taking differently: Take 40 mg by mouth every evening. 1 tab by mouth with evening meal 07/28/17  Yes Mack Hook, MD  glipiZIDE (GLUCOTROL XL) 5 MG 24 hr tablet TAKE ONE TABLET BY MOUTH ONCE DAILY WITH BREAKFAST Patient taking differently: Take 5 mg by mouth once a day 07/02/17  Yes Mack Hook, MD  metaxalone (SKELAXIN) 400 MG tablet TAKE ONE TABLET BY  MOUTH AT BEDTIME AS NEEDED FOR NECK AND BACK PAIN Patient not taking: Reported on 11/14/2017 10/24/16   Mack Hook, MD   Liver Function Tests No results for input(s): AST, ALT, ALKPHOS, BILITOT, PROT, ALBUMIN in the last 168 hours. No results for input(s): LIPASE, AMYLASE in the last 168 hours. CBC Recent Labs  Lab 11/14/17 1411  WBC 14.4*  HGB 6.7*  HCT 21.2*  MCV 89.8  PLT 045   Basic Metabolic Panel Recent Labs  Lab 11/14/17 1411  NA 140  K 4.5  CL 114*  CO2 13*  GLUCOSE 58*  BUN 80*  CREATININE 10.53*  CALCIUM 6.8*     Vitals:   11/14/17 1745 11/14/17 1800 11/14/17 1815 11/14/17 1830  BP: (!) 149/69 (!) 138/58 137/66 (!) 145/64  Pulse: 98 99 (!) 102 (!) 103  Resp: (!) 24  (!) 22 (!) 23  Temp:      TempSrc:      SpO2: 95% 94% 95% 95%   Exam: Gen alert, some deep breathing off and on No rash, cyanosis or gangrene Sclera anicteric, throat clear  JVD 12 cm Chest occ scattered crackles R side, L clear RRR no MRG, tachy Abd soft ntnd no mass or ascites +bs mod obese GU defer MS no joint effusions or deformity Ext no LE or UE edema / no wounds or ulcers Neuro is alert, Ox 3 , nf, no asterixis    Home meds: norvasc 7.5 qd/ lipitor 40 qd/ glipizide xl 5 qam/ skelaxin 400 prn/ tylenol prn  Na 140  K 4.5  BN 80  CR 10.53  CO2 13  eGFR 4 ml/min   Trop 0.00 WBC 14k  Hb 6.7   Hct 21  plt 290     Assessment: 1. SOB/ bilat PNA - admit, IV abx 2. CKD advanced stage - have d/w renal , they will consult 3. Anemia - unclear cause, most likely CKD.  Will give 1 unit prbc tonight, check anemia panel and stool guiac 4. Met acidosis - prob renal, tolerating ok 5. Volume - on gross vol excess 6. DM2 - on po meds at home, will hold them and use SSI for now 7. HTN - cont norvasc, BP's good   Plan - as above       [place] [facility] Pager [phone]   If 7PM-7AM, please contact night-coverage [email or web address] Password  [facility] 11/14/2017, 6:59 PM

## 2017-11-14 NOTE — ED Notes (Signed)
Pt urinated and flushed down toilet.  She states she will probably have to urinate again soon.

## 2017-11-15 ENCOUNTER — Encounter (HOSPITAL_COMMUNITY): Payer: Self-pay | Admitting: Interventional Radiology

## 2017-11-15 ENCOUNTER — Inpatient Hospital Stay (HOSPITAL_COMMUNITY): Payer: Self-pay

## 2017-11-15 DIAGNOSIS — J188 Other pneumonia, unspecified organism: Secondary | ICD-10-CM

## 2017-11-15 DIAGNOSIS — I479 Paroxysmal tachycardia, unspecified: Secondary | ICD-10-CM

## 2017-11-15 DIAGNOSIS — J189 Pneumonia, unspecified organism: Secondary | ICD-10-CM

## 2017-11-15 HISTORY — DX: Pneumonia, unspecified organism: J18.9

## 2017-11-15 HISTORY — DX: Other pneumonia, unspecified organism: J18.8

## 2017-11-15 HISTORY — PX: IR FLUORO GUIDE CV LINE RIGHT: IMG2283

## 2017-11-15 HISTORY — PX: IR US GUIDE VASC ACCESS RIGHT: IMG2390

## 2017-11-15 LAB — IRON AND TIBC
IRON: 29 ug/dL (ref 28–170)
Saturation Ratios: 12 % (ref 10.4–31.8)
TIBC: 249 ug/dL — AB (ref 250–450)
UIBC: 220 ug/dL

## 2017-11-15 LAB — CBC
HCT: 24.7 % — ABNORMAL LOW (ref 36.0–46.0)
HCT: 22.5 % — ABNORMAL LOW (ref 36.0–46.0)
Hemoglobin: 8.1 g/dL — ABNORMAL LOW (ref 12.0–15.0)
Hemoglobin: 7.4 g/dL — ABNORMAL LOW (ref 12.0–15.0)
MCH: 28.2 pg (ref 26.0–34.0)
MCH: 28.1 pg (ref 26.0–34.0)
MCHC: 32.8 g/dL (ref 30.0–36.0)
MCHC: 32.9 g/dL (ref 30.0–36.0)
MCV: 86.1 fL (ref 78.0–100.0)
MCV: 85.6 fL (ref 78.0–100.0)
PLATELETS: 273 10*3/uL (ref 150–400)
PLATELETS: 278 10*3/uL (ref 150–400)
RBC: 2.87 MIL/uL — ABNORMAL LOW (ref 3.87–5.11)
RBC: 2.63 MIL/uL — AB (ref 3.87–5.11)
RDW: 15.7 % — AB (ref 11.5–15.5)
RDW: 15.7 % — AB (ref 11.5–15.5)
WBC: 8.8 10*3/uL (ref 4.0–10.5)
WBC: 9 10*3/uL (ref 4.0–10.5)

## 2017-11-15 LAB — COMPREHENSIVE METABOLIC PANEL
ALT: 34 U/L (ref 14–54)
AST: 40 U/L (ref 15–41)
Albumin: 2.6 g/dL — ABNORMAL LOW (ref 3.5–5.0)
Alkaline Phosphatase: 243 U/L — ABNORMAL HIGH (ref 38–126)
Anion gap: 12 (ref 5–15)
BUN: 37 mg/dL — ABNORMAL HIGH (ref 6–20)
CO2: 20 mmol/L — ABNORMAL LOW (ref 22–32)
Calcium: 7.2 mg/dL — ABNORMAL LOW (ref 8.9–10.3)
Chloride: 104 mmol/L (ref 101–111)
Creatinine, Ser: 5.79 mg/dL — ABNORMAL HIGH (ref 0.44–1.00)
GFR calc Af Amer: 9 mL/min — ABNORMAL LOW (ref >60)
GFR calc non Af Amer: 7 mL/min — ABNORMAL LOW (ref >60)
Glucose, Bld: 125 mg/dL — ABNORMAL HIGH (ref 65–99)
Potassium: 4 mmol/L (ref 3.5–5.1)
Sodium: 136 mmol/L (ref 135–145)
Total Bilirubin: 1.7 mg/dL — ABNORMAL HIGH (ref 0.3–1.2)
Total Protein: 6.2 g/dL — ABNORMAL LOW (ref 6.5–8.1)

## 2017-11-15 LAB — MAGNESIUM: Magnesium: 2.1 mg/dL (ref 1.7–2.4)

## 2017-11-15 LAB — GLUCOSE, CAPILLARY
GLUCOSE-CAPILLARY: 76 mg/dL (ref 65–99)
GLUCOSE-CAPILLARY: 69 mg/dL (ref 65–99)
GLUCOSE-CAPILLARY: 156 mg/dL — AB (ref 65–99)
Glucose-Capillary: 107 mg/dL — ABNORMAL HIGH (ref 65–99)
Glucose-Capillary: 118 mg/dL — ABNORMAL HIGH (ref 65–99)

## 2017-11-15 LAB — RENAL FUNCTION PANEL
Albumin: 2.8 g/dL — ABNORMAL LOW (ref 3.5–5.0)
Anion gap: 12 (ref 5–15)
BUN: 85 mg/dL — AB (ref 6–20)
CHLORIDE: 114 mmol/L — AB (ref 101–111)
CO2: 14 mmol/L — AB (ref 22–32)
CREATININE: 10.56 mg/dL — AB (ref 0.44–1.00)
Calcium: 6.3 mg/dL — CL (ref 8.9–10.3)
GFR calc Af Amer: 4 mL/min — ABNORMAL LOW (ref >60)
GFR calc non Af Amer: 4 mL/min — ABNORMAL LOW (ref >60)
GLUCOSE: 94 mg/dL (ref 65–99)
Phosphorus: 5.3 mg/dL — ABNORMAL HIGH (ref 2.5–4.6)
Potassium: 4 mmol/L (ref 3.5–5.1)
SODIUM: 140 mmol/L (ref 135–145)

## 2017-11-15 LAB — HIV ANTIBODY (ROUTINE TESTING W REFLEX): HIV Screen 4th Generation wRfx: NONREACTIVE

## 2017-11-15 MED ORDER — SODIUM CHLORIDE 0.9 % IV BOLUS (SEPSIS): 250.0000 mL | Freq: Once | INTRAVENOUS | Status: DC

## 2017-11-15 MED ORDER — METOPROLOL TARTRATE 5 MG/5ML IV SOLN
5.0000 mg | Freq: Once | INTRAVENOUS | Status: AC
  Administered 2017-11-15: 5 mg via INTRAVENOUS

## 2017-11-15 MED ORDER — HEPARIN SODIUM (PORCINE) 1000 UNIT/ML IJ SOLN
INTRAMUSCULAR | Status: AC
  Filled 2017-11-15: qty 1

## 2017-11-15 MED ORDER — HEPARIN SODIUM (PORCINE) 1000 UNIT/ML DIALYSIS
20.0000 [IU]/kg | INTRAMUSCULAR | Status: DC | PRN
  Filled 2017-11-15: qty 2

## 2017-11-15 MED ORDER — LIDOCAINE HCL (PF) 1 % IJ SOLN
5.0000 mL | INTRAMUSCULAR | Status: DC | PRN
  Filled 2017-11-15: qty 5

## 2017-11-15 MED ORDER — CALCIUM GLUCONATE 10 % IV SOLN
1.0000 g | Freq: Once | INTRAVENOUS | Status: AC
  Administered 2017-11-15: 1 g via INTRAVENOUS
  Filled 2017-11-15: qty 10

## 2017-11-15 MED ORDER — SODIUM CHLORIDE 0.9 % IV SOLN: 100.0000 mL | INTRAVENOUS | Status: DC | PRN

## 2017-11-15 MED ORDER — INFLUENZA VAC SPLIT QUAD 0.5 ML IM SUSY
0.5000 mL | PREFILLED_SYRINGE | INTRAMUSCULAR | Status: AC
  Administered 2017-11-16: 0.5 mL via INTRAMUSCULAR
  Filled 2017-11-15: qty 0.5

## 2017-11-15 MED ORDER — LIDOCAINE HCL 1 % IJ SOLN
INTRAMUSCULAR | Status: AC
  Filled 2017-11-15: qty 20

## 2017-11-15 MED ORDER — FLUTICASONE PROPIONATE 50 MCG/ACT NA SUSP
1.0000 | Freq: Every day | NASAL | Status: DC
  Administered 2017-11-15 – 2017-11-23 (×8): 1 via NASAL
  Filled 2017-11-15 (×2): qty 16

## 2017-11-15 MED ORDER — LIDOCAINE HCL 1 % IJ SOLN
INTRAMUSCULAR | Status: DC | PRN
  Administered 2017-11-15: 10 mL

## 2017-11-15 MED ORDER — METOPROLOL TARTRATE 5 MG/5ML IV SOLN
INTRAVENOUS | Status: AC
  Filled 2017-11-15: qty 5

## 2017-11-15 MED ORDER — SODIUM CHLORIDE 0.9 % IV BOLUS (SEPSIS)
250.0000 mL | Freq: Once | INTRAVENOUS | Status: AC
  Administered 2017-11-15: 250 mL via INTRAVENOUS

## 2017-11-15 MED ORDER — LIDOCAINE-PRILOCAINE 2.5-2.5 % EX CREA
1.0000 "application " | TOPICAL_CREAM | CUTANEOUS | Status: DC | PRN
  Filled 2017-11-15: qty 5

## 2017-11-15 MED ORDER — METOPROLOL TARTRATE 25 MG PO TABS
25.0000 mg | ORAL_TABLET | Freq: Four times a day (QID) | ORAL | Status: DC
  Administered 2017-11-15 – 2017-11-16 (×2): 25 mg via ORAL
  Filled 2017-11-15 (×2): qty 1

## 2017-11-15 MED ORDER — ALTEPLASE 2 MG IJ SOLR
2.0000 mg | Freq: Once | INTRAMUSCULAR | Status: DC | PRN
  Filled 2017-11-15: qty 2

## 2017-11-15 MED ORDER — PENTAFLUOROPROP-TETRAFLUOROETH EX AERO: 1.0000 "application " | INHALATION_SPRAY | CUTANEOUS | Status: DC | PRN

## 2017-11-15 MED ORDER — HEPARIN SODIUM (PORCINE) 1000 UNIT/ML DIALYSIS
1000.0000 [IU] | INTRAMUSCULAR | Status: DC | PRN
  Filled 2017-11-15: qty 1

## 2017-11-15 NOTE — Progress Notes (Signed)
Notified Puja, rapid response RN that pt having bursts of HR in the 200's on telemetry after coming back from dialysis. EKG done. Rapid response RN came to bedside to assess pt. Notified Schorr, NP of situation ordered labs and gave Lopressor IV.  Schorr, NP came to bedside to assess pt. Pt's son at bedside translating to pt. Pt states she feels fine only dizziness at times. Metoprolol and NS bolus given. HR now in the 80-90s. Will continue to monitor pt. Ranelle Oyster, RN

## 2017-11-15 NOTE — Progress Notes (Addendum)
CRITICAL VALUE ALERT  Critical Value:  Ca = 6.3  Date & Time Notied:  11/15/2017; 17:23  Provider Notified: Dr. Broadus John  Orders Received/Actions taken: monitor; see MAR

## 2017-11-15 NOTE — Care Management Note (Signed)
Case Management Note  Patient Details  Name: [name] MRN: [id number] Date of Birth: May 02, 1961  Subjective/Objective:    Admitted with SOB/PNA, ESRD. From home with family. Pt speaks spanish, little english. [name](son) @ bedside states mom has lived in [place] X 20 yrs and will be moving back to [place] within a month along with he and other family members.               [name] (Other) [name] (son)     [phone] / [phone]     PCP: [facility]  Action/Plan: Return to home when medically stable. CM following for transitional needs to home.  Post hospital follow up scheduled for 11/29/2017 at 4:00pm with Dr. Mack Hook @ the [facility], noted on AVS.  Expected Discharge Date:                  Expected Discharge Plan:     In-House Referral:     Discharge planning Services     Post Acute Care Choice:    Choice offered to:     DME Arranged:    DME Agency:     HH Arranged:    HH Agency:     Status of Service:     If discussed at [employer] of Stay Meetings, dates discussed:    Additional Comments:  Sharin Mons, RN 11/15/2017, 3:06 PM

## 2017-11-15 NOTE — Progress Notes (Signed)
Patient back from IR. Alert and oriented x 4; no complaints of pain or other discomfort.  Right IJ triple lumen HD catheter in place; dressing intact, clean, dry. Will continue to monitor.

## 2017-11-15 NOTE — Progress Notes (Signed)
Paged by bedside RN regarding pt having burst of SVT into the 200's. RRN Puja at the bedside as well. The burst occur 4-6 times per minute and last approximately 2-5 seconds. She then returns to a baseline rate of 80's-90's.  EKG shows ST at rate of 130bpm. All other VSS. Upon arrival to the bedside pt resting comfortably in NAD. Pt is asymptomatic during the episodes although she does endorse some dizziness which has been present for approximately one week. She is A&Ox4 and she recently returned from receiving HD (first treatment).  Pt given 5mg  of Lopressor IV which slowed the burst of SVT to the 140's-150's and she was also given 295ml NS. Subsequent EKG after the interventions showed SR with a rate of 87bpm. Upon exiting the unit pt resting comfortably in bed with family members at bedside.  Assessment & Plan Rhythym Change- Consulted with Dr. [place] [facility] who recommended that the pt be started on 25mg  of PO Lopressor Q6H and the pt received her first dose at 2242. We will continue to monitor  Arby Barrette NP-C, AGPCNP-BC [facility] Pager [phone]  CRITICAL CARE Performed by: Vertis Kelch   Total critical care time: 30 minutes  Critical care time was exclusive of separately billable procedures and treating other patients.  Critical care was necessary to treat or prevent imminent or life-threatening deterioration.  Critical care was time spent personally by me on the following activities: development of treatment plan with patient and/or surrogate as well as nursing, discussions with consultants, evaluation of patient's response to treatment, examination of patient, obtaining history from patient or surrogate, ordering and performing treatments and interventions, ordering and review of laboratory studies, ordering and review of radiographic studies, pulse oximetry and re-evaluation of patient's condition.

## 2017-11-15 NOTE — Progress Notes (Signed)
Assessment/Plan: 1. ESRD, new start  2. ? PNA - started on std CAP Abx.  Will ask IR to place [facility] and when better will need AVF  Subjective: Interval History: I spoke to her via translator, she needs to start dialysis  Objective: Vital signs in last 24 hours: Temp:  [98.1 F (36.7 C)-99.9 F (37.7 C)] 98.6 F (37 C) (11/29 0514) Pulse Rate:  [87-103] 95 (11/29 0514) Resp:  [14-28] 18 (11/29 0514) BP: (126-151)/(54-80) 126/80 (11/29 0514) SpO2:  [93 %-99 %] 95 % (11/29 0514) Weight:  [60.6 kg (133 lb 9.6 oz)] 60.6 kg (133 lb 9.6 oz) (11/29 0514) Weight change:   Intake/Output from previous day: 11/28 0701 - 11/29 0700 In: 300 [IV Piggyback:300] Out: -  Intake/Output this shift: No intake/output data recorded.  General appearance: alert, cooperative and dysspneic Resp: clear to auscultation bilaterally Cardio: regular rate and rhythm, S1, S2 normal, no murmur, click, rub or gallop Extremities: extremities normal, atraumatic, no cyanosis or edema  Lab Results: Recent Labs    11/14/17 1411  WBC 14.4*  HGB 6.7*  HCT 21.2*  PLT 290   BMET:  Recent Labs    11/14/17 1411  NA 140  K 4.5  CL 114*  CO2 13*  GLUCOSE 58*  BUN 80*  CREATININE 10.53*  CALCIUM 6.8*   No results for input(s): PTH in the last 72 hours. Iron Studies: No results for input(s): IRON, TIBC, TRANSFERRIN, FERRITIN in the last 72 hours. Studies/Results: Ct Abdomen Pelvis Wo Contrast  Result Date: 11/14/2017 CLINICAL DATA:  56 y/o  F; chest pain and left leg pain for 3 days. EXAM: CT CHEST, ABDOMEN AND PELVIS WITHOUT CONTRAST TECHNIQUE: Multidetector CT imaging of the chest, abdomen and pelvis was performed following the standard protocol without IV contrast. COMPARISON:  None. FINDINGS: CT CHEST FINDINGS Cardiovascular: No significant vascular findings. Normal heart size. No pericardial effusion. Normal caliber thoracic aorta. Moderate calcific atherosclerosis. Mild coronary artery calcification.  Mediastinum/Nodes: No enlarged mediastinal, hilar, or axillary lymph nodes. Thyroid gland, trachea, and esophagus demonstrate no significant findings. Lungs/Pleura: Dense consolidation within the right lower lobe and patchy foci of consolidation scattered throughout the lungs bilaterally. Moderate right and small left pleural effusions. No pneumothorax. Musculoskeletal: No chest wall mass or suspicious bone lesions identified. CT ABDOMEN PELVIS FINDINGS Hepatobiliary: No focal liver abnormality is seen. No gallstones, gallbladder wall thickening, or biliary dilatation. Pancreas: Unremarkable. No pancreatic ductal dilatation or surrounding inflammatory changes. Spleen: Normal in size without focal abnormality. Adrenals/Urinary Tract: Normal adrenal glands. Severe right kidney atrophy. No hydronephrosis. No focal left kidney lesion identified. Normal bladder. Stomach/Bowel: Stomach is within normal limits. Appendix appears normal. No evidence of bowel wall thickening, distention, or inflammatory changes. Vascular/Lymphatic: Aortic atherosclerosis. No enlarged abdominal or pelvic lymph nodes. Reproductive: Status post hysterectomy. No adnexal masses. Other: Diastases rectus and several small hernias in the umbilical region containing fat. Musculoskeletal: No acute or significant osseous findings. IMPRESSION: 1. Multifocal pneumonia with dense consolidation in the right upper lobe along the major fissure. 2. Moderate right and small left pleural effusion. 3. Mild coronary artery calcific atherosclerosis. Aortic atherosclerosis. 4. Severe right kidney atrophy. 5. Diastases rectus and several small hernias containing fat in the umbilical region. Electronically Signed   By: Kristine Garbe M.D.   On: 11/14/2017 19:28   Dg Chest 2 View  Result Date: 11/14/2017 CLINICAL DATA:  2 day hx. Sob, chest upper right chest pain radiating through to right upper posterior chest. Bilat. Arm pain. Hx  DM, HTN EXAM: CHEST  2  VIEW COMPARISON:  None. FINDINGS: Heart is mildly enlarged. No pulmonary edema. There patchy airspace filling opacities within the right upper lobe, right lower lobe, and left upper lobe. There pleural changes bilaterally, consistent small effusions or thickening. Degenerative changes are seen in the shoulders bilaterally. There is atherosclerotic calcification of the abdominal aorta. IMPRESSION: 1. Multifocal pulmonary infiltrates. 2. Cardiomegaly without pulmonary edema. Electronically Signed   By: Nolon Nations M.D.   On: 11/14/2017 15:25   Ct Chest Wo Contrast  Result Date: 11/14/2017 CLINICAL DATA:  56 y/o  F; chest pain and left leg pain for 3 days. EXAM: CT CHEST, ABDOMEN AND PELVIS WITHOUT CONTRAST TECHNIQUE: Multidetector CT imaging of the chest, abdomen and pelvis was performed following the standard protocol without IV contrast. COMPARISON:  None. FINDINGS: CT CHEST FINDINGS Cardiovascular: No significant vascular findings. Normal heart size. No pericardial effusion. Normal caliber thoracic aorta. Moderate calcific atherosclerosis. Mild coronary artery calcification. Mediastinum/Nodes: No enlarged mediastinal, hilar, or axillary lymph nodes. Thyroid gland, trachea, and esophagus demonstrate no significant findings. Lungs/Pleura: Dense consolidation within the right lower lobe and patchy foci of consolidation scattered throughout the lungs bilaterally. Moderate right and small left pleural effusions. No pneumothorax. Musculoskeletal: No chest wall mass or suspicious bone lesions identified. CT ABDOMEN PELVIS FINDINGS Hepatobiliary: No focal liver abnormality is seen. No gallstones, gallbladder wall thickening, or biliary dilatation. Pancreas: Unremarkable. No pancreatic ductal dilatation or surrounding inflammatory changes. Spleen: Normal in size without focal abnormality. Adrenals/Urinary Tract: Normal adrenal glands. Severe right kidney atrophy. No hydronephrosis. No focal left kidney lesion  identified. Normal bladder. Stomach/Bowel: Stomach is within normal limits. Appendix appears normal. No evidence of bowel wall thickening, distention, or inflammatory changes. Vascular/Lymphatic: Aortic atherosclerosis. No enlarged abdominal or pelvic lymph nodes. Reproductive: Status post hysterectomy. No adnexal masses. Other: Diastases rectus and several small hernias in the umbilical region containing fat. Musculoskeletal: No acute or significant osseous findings. IMPRESSION: 1. Multifocal pneumonia with dense consolidation in the right upper lobe along the major fissure. 2. Moderate right and small left pleural effusion. 3. Mild coronary artery calcific atherosclerosis. Aortic atherosclerosis. 4. Severe right kidney atrophy. 5. Diastases rectus and several small hernias containing fat in the umbilical region. Electronically Signed   By: Kristine Garbe M.D.   On: 11/14/2017 19:28    Scheduled: . amLODipine  7.5 mg Oral Daily  . atorvastatin  40 mg Oral QPM  . enoxaparin (LOVENOX) injection  30 mg Subcutaneous QHS  . [START ON 11/16/2017] Influenza vac split quadrivalent PF  0.5 mL Intramuscular Tomorrow-1000  . insulin aspart  0-5 Units Subcutaneous QHS  . insulin aspart  0-9 Units Subcutaneous TID WC  . sodium chloride flush  3 mL Intravenous Q12H      LOS: 1 day   Estanislado Emms 11/15/2017,9:05 AM

## 2017-11-15 NOTE — Progress Notes (Addendum)
PROGRESS NOTE    [name]  LKG:[id number] DOB: 16-Dec-1961 DOA: 11/14/2017 PCP: Mack Hook, MD  Brief Narrative:  [name] is an 56 y.o. female [facility] nearing dialysis who had been refusing dialysis but has been to dialysis classes followed by [facility] Dr. Justin Mend. She is p/w dyspnea, cough but denies myalgias, rigors, fevers. She has had right sided chest pain not associated with exertion. She does have some anorexia and anorexia but denies any vomiting. There were multiple pulmonary infiltrrates present and cardiomegaly without pulmonary edema on CXR. creatinine is 10, bicarbonate is 13 Renal consulting, plan for HD catheter to start HD, pt agrees now  Assessment & Plan:   Principal Problem:   Multifocal pneumonia -continue Coverage with ceftriaxone and azithromycin -cT chest notes consolidation not pulmonary edema -FU blood and sputum cultures -follow-up chest x-ray in 4-6 weeks    Progressive CKD5/metabolic acidosis -presenting with a creatinine of 10 and bicarbonate of 13 -Finally agreeable to start hemodialysis, renal consult taking, IR consulted for HD catheter today -Fortunately no significant signs of volume overload at this time -Diet education    Diabetes mellitus  -glipizide on hold -Continue sliding scale insulin -HbA1c in August was 7.3, Will repeat    Essential hypertension -will hold amlodipine due to expected hypotension after starting HD   Anemia of chronic disease -And iron deficiency noted on anemia panel -No overt blood loss noted -Status post 1 unit PRBC on admission yesterday, will likely need more blood -monitor Hb, I will give her IV iron tomorrow, labs pending this am  DVT prophylaxis: lovenox Code Status: Full Code Family Communication: Son at bedside Disposition Plan: Home when stable  Consultants:   Renal   Procedures:   Antimicrobials:  Rocephin/zithromax 11/18  Subjective: -feels better, some dyspnea, +  nausea and anorexia  Objective: Vitals:   11/15/17 0136 11/15/17 0200 11/15/17 0330 11/15/17 0514  BP: 132/63 (!) 127/54 140/70 126/80  Pulse: 91 87 97 95  Resp: (!) 22 (!) 24 16 18   Temp:   98.8 F (37.1 C) 98.6 F (37 C)  TempSrc:   Oral Oral  SpO2: 95% 96%  95%  Weight:    60.6 kg (133 lb 9.6 oz)  Height:    5\' 1"  (1.549 m)    Intake/Output Summary (Last 24 hours) at 11/15/2017 1346 Last data filed at 11/15/2017 0917 Gross per 24 hour  Intake 660 ml  Output -  Net 660 ml   Filed Weights   11/15/17 0514  Weight: 60.6 kg (133 lb 9.6 oz)    Examination:  General exam: Appears calm and comfortable, no distress Respiratory system: but decreased breath sounds at bases Cardiovascular system: S1 & S2 heard, RRR. No JVD, murmurs, rubs, gallops Gastrointestinal system: abdomen is obese soft mildly distended, positive bowel, some pitting edema soundsCentral nervous system: Alert and oriented. No focal neurological deficits. Extremities: Symmetric 5 x 5 power, trace edema Skin: No rashes, lesions or ulcers Psychiatry: Judgement and insight appear normal. Mood & affect appropriate.     Data Reviewed:   CBC: Recent Labs  Lab 11/14/17 1411  WBC 14.4*  HGB 6.7*  HCT 21.2*  MCV 89.8  PLT 664   Basic Metabolic Panel: Recent Labs  Lab 11/14/17 1411  NA 140  K 4.5  CL 114*  CO2 13*  GLUCOSE 58*  BUN 80*  CREATININE 10.53*  CALCIUM 6.8*   GFR: Estimated Creatinine Clearance: 5 mL/min (A) (by C-G formula based on SCr  of 10.53 mg/dL (H)). Liver Function Tests: No results for input(s): AST, ALT, ALKPHOS, BILITOT, PROT, ALBUMIN in the last 168 hours. No results for input(s): LIPASE, AMYLASE in the last 168 hours. No results for input(s): AMMONIA in the last 168 hours. Coagulation Profile: Recent Labs  Lab 11/14/17 1743  INR 1.18   Cardiac Enzymes: No results for input(s): CKTOTAL, CKMB, CKMBINDEX, TROPONINI in the last 168 hours. BNP (last 3 results) No  results for input(s): PROBNP in the last 8760 hours. HbA1C: No results for input(s): HGBA1C in the last 72 hours. CBG: Recent Labs  Lab 11/14/17 2247 11/15/17 0344 11/15/17 0750 11/15/17 0842 11/15/17 1204  GLUCAP 65 76 69 107* 156*   Lipid Profile: No results for input(s): CHOL, HDL, LDLCALC, TRIG, CHOLHDL, LDLDIRECT in the last 72 hours. Thyroid Function Tests: No results for input(s): TSH, T4TOTAL, FREET4, T3FREE, THYROIDAB in the last 72 hours. Anemia Panel: Recent Labs    11/15/17 0838  TIBC 249*  IRON 29   Urine analysis:    Component Value Date/Time   COLORURINE AMBER (A) 10/18/2011 1003   APPEARANCEUR CLOUDY (A) 10/18/2011 1003   LABSPEC 1.020 10/18/2011 1003   PHURINE 6.0 10/18/2011 1003   GLUCOSEU 100 (A) 10/18/2011 1003   HGBUR SMALL (A) 10/18/2011 1003   BILIRUBINUR NEGATIVE 10/18/2011 1003   KETONESUR NEGATIVE 10/18/2011 1003   PROTEINUR 30 (A) 10/18/2011 1003   UROBILINOGEN 0.2 10/18/2011 1003   NITRITE NEGATIVE 10/18/2011 1003   LEUKOCYTESUR MODERATE (A) 10/18/2011 1003   Sepsis Labs: @LABRCNTIP (procalcitonin:4,lacticidven:4)  )No results found for this or any previous visit (from the past 240 hour(s)).       Radiology Studies: Ct Abdomen Pelvis Wo Contrast  Result Date: 11/14/2017 CLINICAL DATA:  56 y/o  F; chest pain and left leg pain for 3 days. EXAM: CT CHEST, ABDOMEN AND PELVIS WITHOUT CONTRAST TECHNIQUE: Multidetector CT imaging of the chest, abdomen and pelvis was performed following the standard protocol without IV contrast. COMPARISON:  None. FINDINGS: CT CHEST FINDINGS Cardiovascular: No significant vascular findings. Normal heart size. No pericardial effusion. Normal caliber thoracic aorta. Moderate calcific atherosclerosis. Mild coronary artery calcification. Mediastinum/Nodes: No enlarged mediastinal, hilar, or axillary lymph nodes. Thyroid gland, trachea, and esophagus demonstrate no significant findings. Lungs/Pleura: Dense  consolidation within the right lower lobe and patchy foci of consolidation scattered throughout the lungs bilaterally. Moderate right and small left pleural effusions. No pneumothorax. Musculoskeletal: No chest wall mass or suspicious bone lesions identified. CT ABDOMEN PELVIS FINDINGS Hepatobiliary: No focal liver abnormality is seen. No gallstones, gallbladder wall thickening, or biliary dilatation. Pancreas: Unremarkable. No pancreatic ductal dilatation or surrounding inflammatory changes. Spleen: Normal in size without focal abnormality. Adrenals/Urinary Tract: Normal adrenal glands. Severe right kidney atrophy. No hydronephrosis. No focal left kidney lesion identified. Normal bladder. Stomach/Bowel: Stomach is within normal limits. Appendix appears normal. No evidence of bowel wall thickening, distention, or inflammatory changes. Vascular/Lymphatic: Aortic atherosclerosis. No enlarged abdominal or pelvic lymph nodes. Reproductive: Status post hysterectomy. No adnexal masses. Other: Diastases rectus and several small hernias in the umbilical region containing fat. Musculoskeletal: No acute or significant osseous findings. IMPRESSION: 1. Multifocal pneumonia with dense consolidation in the right upper lobe along the major fissure. 2. Moderate right and small left pleural effusion. 3. Mild coronary artery calcific atherosclerosis. Aortic atherosclerosis. 4. Severe right kidney atrophy. 5. Diastases rectus and several small hernias containing fat in the umbilical region. Electronically Signed   By: Kristine Garbe M.D.   On: 11/14/2017 19:28  Dg Chest 2 View  Result Date: 11/14/2017 CLINICAL DATA:  2 day hx. Sob, chest upper right chest pain radiating through to right upper posterior chest. Bilat. Arm pain. Hx DM, HTN EXAM: CHEST  2 VIEW COMPARISON:  None. FINDINGS: Heart is mildly enlarged. No pulmonary edema. There patchy airspace filling opacities within the right upper lobe, right lower lobe, and  left upper lobe. There pleural changes bilaterally, consistent small effusions or thickening. Degenerative changes are seen in the shoulders bilaterally. There is atherosclerotic calcification of the abdominal aorta. IMPRESSION: 1. Multifocal pulmonary infiltrates. 2. Cardiomegaly without pulmonary edema. Electronically Signed   By: Nolon Nations M.D.   On: 11/14/2017 15:25   Ct Chest Wo Contrast  Result Date: 11/14/2017 CLINICAL DATA:  56 y/o  F; chest pain and left leg pain for 3 days. EXAM: CT CHEST, ABDOMEN AND PELVIS WITHOUT CONTRAST TECHNIQUE: Multidetector CT imaging of the chest, abdomen and pelvis was performed following the standard protocol without IV contrast. COMPARISON:  None. FINDINGS: CT CHEST FINDINGS Cardiovascular: No significant vascular findings. Normal heart size. No pericardial effusion. Normal caliber thoracic aorta. Moderate calcific atherosclerosis. Mild coronary artery calcification. Mediastinum/Nodes: No enlarged mediastinal, hilar, or axillary lymph nodes. Thyroid gland, trachea, and esophagus demonstrate no significant findings. Lungs/Pleura: Dense consolidation within the right lower lobe and patchy foci of consolidation scattered throughout the lungs bilaterally. Moderate right and small left pleural effusions. No pneumothorax. Musculoskeletal: No chest wall mass or suspicious bone lesions identified. CT ABDOMEN PELVIS FINDINGS Hepatobiliary: No focal liver abnormality is seen. No gallstones, gallbladder wall thickening, or biliary dilatation. Pancreas: Unremarkable. No pancreatic ductal dilatation or surrounding inflammatory changes. Spleen: Normal in size without focal abnormality. Adrenals/Urinary Tract: Normal adrenal glands. Severe right kidney atrophy. No hydronephrosis. No focal left kidney lesion identified. Normal bladder. Stomach/Bowel: Stomach is within normal limits. Appendix appears normal. No evidence of bowel wall thickening, distention, or inflammatory changes.  Vascular/Lymphatic: Aortic atherosclerosis. No enlarged abdominal or pelvic lymph nodes. Reproductive: Status post hysterectomy. No adnexal masses. Other: Diastases rectus and several small hernias in the umbilical region containing fat. Musculoskeletal: No acute or significant osseous findings. IMPRESSION: 1. Multifocal pneumonia with dense consolidation in the right upper lobe along the major fissure. 2. Moderate right and small left pleural effusion. 3. Mild coronary artery calcific atherosclerosis. Aortic atherosclerosis. 4. Severe right kidney atrophy. 5. Diastases rectus and several small hernias containing fat in the umbilical region. Electronically Signed   By: Kristine Garbe M.D.   On: 11/14/2017 19:28        Scheduled Meds: . amLODipine  7.5 mg Oral Daily  . atorvastatin  40 mg Oral QPM  . enoxaparin (LOVENOX) injection  30 mg Subcutaneous QHS  . fluticasone  1 spray Each Nare Daily  . heparin      . [START ON 11/16/2017] Influenza vac split quadrivalent PF  0.5 mL Intramuscular Tomorrow-1000  . insulin aspart  0-5 Units Subcutaneous QHS  . insulin aspart  0-9 Units Subcutaneous TID WC  . lidocaine      . sodium chloride flush  3 mL Intravenous Q12H   Continuous Infusions: . sodium chloride    . azithromycin Stopped (11/15/17 1010)  . cefTRIAXone (ROCEPHIN)  IV Stopped (11/15/17 0940)     LOS: 1 day    Time spent: 50min    Domenic Polite, MD [facility] Page via [email or web address], password [email or web address] After 7PM please contact night-coverage  11/15/2017, 1:46 PM

## 2017-11-15 NOTE — Progress Notes (Signed)
Chief Complaint: Patient was seen in consultation today for dialysis catheter placement at the request of Dr. Erling Cruz  Referring Physician(s): Dr. Erling Cruz  Supervising Physician: Jacqulynn Cadet  Patient Status: [name] - In-pt  History of Present Illness: [name] is a 56 y.o. female from [place] admitted with acute renal failure and possible pneumonia. She has been seen by Nephrology and needs to start dialysis. IR is asked to place dialysis catheter. PMHx, chart, meds, labs, imaging reviewed. Pt seen at same time as hospitalist and discussed with pt and son via Optometrist. Pt c/o some SOB, not aware of any fever.  Past Medical History:  Diagnosis Date  . Ascites 10/17/2011   See ovarian mass  . Cataract 02/24/2013   Right remains--No benefit for surgery due to retinal traction ischemia, atrophy, holes as per Dr. Ria Dunn, [name]  . Chronic pain syndrome 2013   neck, back, side  . Constipation   . 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 Diabetic nephropathy ([place])    Creatinine 1.38 09/2012  . Hypertension   . Ovarian mass 10/17/2011   27.5 cm endometrioma replacing left ovary with 12 L brown ascites.  Pt. underwent TAH, BSO with removal of the endometrioma as well and removal of the ascites.  No findings of malignancy  . Proliferative diabetic retinopathy ([place]) 09/2012   Followed by [name], Retinal Specialist at [facility] and [name], [facility] [place] specialist    Past Surgical History:  Procedure Laterality Date  . ABDOMINAL HYSTERECTOMY  10/17/2011   BSO for large left ovarian endometrioma  . EXPLORATORY LAPAROTOMY  2012   12 L of ascites drainage  . EYE SURGERY Left 2015   Laser treatment to left eye  . EYE SURGERY Left 10/2014   Cataract extraction, left eye  . TUBAL LIGATION  1986    Allergies: Trazodone  Medications:  Current Facility-Administered Medications:    .  0.9 %  sodium chloride infusion, 250 mL, Intravenous, PRN, Erika Jaffe, MD .  acetaminophen (TYLENOL) tablet 500-1,000 mg, 500-1,000 mg, Oral, Q6H PRN, Erika Jaffe, MD .  amLODipine (NORVASC) tablet 7.5 mg, 7.5 mg, Oral, Daily, Erika Jaffe, MD, 7.5 mg at 11/15/17 0911 .  atorvastatin (LIPITOR) tablet 40 mg, 40 mg, Oral, QPM, Erika Jaffe, MD, 40 mg at 11/14/17 2251 .  azithromycin (ZITHROMAX) 500 mg in dextrose 5 % 250 mL IVPB, 500 mg, Intravenous, Q24H, Erika Jaffe, MD, Stopped at 11/15/17 1010 .  cefTRIAXone (ROCEPHIN) 1 g in dextrose 5 % 50 mL IVPB, 1 g, Intravenous, Q24H, Erika Jaffe, MD, Stopped at 11/15/17 0940 .  enoxaparin (LOVENOX) injection 30 mg, 30 mg, Subcutaneous, QHS, Erika Jaffe, MD, 30 mg at 11/14/17 2251 .  fluticasone (FLONASE) 50 MCG/ACT nasal spray 1 spray, 1 spray, Each Nare, Daily, Erika Polite, MD .  Derrill Memo ON 11/16/2017] Influenza vac split quadrivalent PF (FLUARIX) injection 0.5 mL, 0.5 mL, Intramuscular, Tomorrow-1000, Schertz, Robert, MD .  insulin aspart (novoLOG) injection 0-5 Units, 0-5 Units, Subcutaneous, QHS, Schertz, Robert, MD .  insulin aspart (novoLOG) injection 0-9 Units, 0-9 Units, Subcutaneous, TID WC, Schertz, Robert, MD .  sodium chloride flush (NS) 0.9 % injection 3 mL, 3 mL, Intravenous, Q12H, Erika Jaffe, MD, 3 mL at 11/15/17 0910 .  sodium chloride flush (NS) 0.9 % injection 3 mL, 3 mL, Intravenous, PRN, Erika Jaffe, MD    Family History  Problem Relation Age of Onset  . Heart  attack Mother   . Cerebral palsy Son        With developmental delay    Social History   Socioeconomic History  . Marital status: Married    Spouse name: None  . Number of children: 4  . Years of education: None  . Highest education level: None  Social Needs  . Financial resource strain: None  . Food insecurity - worry: None  . Food insecurity - inability: None  . Transportation needs - medical: None  . Transportation  needs - non-medical: None  Occupational History  . Occupation: Housewife  Tobacco Use  . Smoking status: Never Smoker  . Smokeless tobacco: Never Used  Substance and Sexual Activity  . Alcohol use: No    Alcohol/week: 0.0 oz  . Drug use: No  . Sexual activity: Not Currently  Other Topics Concern  . None  Social History Narrative   Lives at home with husband and adult son with developmental disabilities   She is main caretaker for son   Isolation with loss of vision    Review of Systems: A 12 point ROS discussed and pertinent positives are indicated in the HPI above.  All other systems are negative.  Review of Systems  Vital Signs: BP 126/80 (BP Location: Left Arm)   Pulse 95   Temp 98.6 F (37 C) (Oral)   Resp 18   Ht 5\' 1"  (1.549 m)   Wt 133 lb 9.6 oz (60.6 kg)   SpO2 95%   BMI 25.24 kg/m   Physical Exam  Constitutional: She is oriented to person, place, and time. She appears well-developed.  HENT:  Head: Normocephalic and atraumatic.  Mouth/Throat: Oropharynx is clear and moist.  Neck: Normal range of motion. No JVD present.  Cardiovascular: Normal rate, regular rhythm and normal heart sounds.  Pulmonary/Chest: No accessory muscle usage. No apnea. No respiratory distress. She has no decreased breath sounds. She has no wheezes. She has rhonchi. She has no rales.  Neurological: She is alert and oriented to person, place, and time.  Skin: Skin is warm and dry.  Psychiatric: She has a normal mood and affect.    Imaging: Ct Abdomen Pelvis Wo Contrast  Result Date: 11/14/2017 CLINICAL DATA:  56 y/o  F; chest pain and left leg pain for 3 days. EXAM: CT CHEST, ABDOMEN AND PELVIS WITHOUT CONTRAST TECHNIQUE: Multidetector CT imaging of the chest, abdomen and pelvis was performed following the standard protocol without IV contrast. COMPARISON:  None. FINDINGS: CT CHEST FINDINGS Cardiovascular: No significant vascular findings. Normal heart size. No pericardial effusion.  Normal caliber thoracic aorta. Moderate calcific atherosclerosis. Mild coronary artery calcification. Mediastinum/Nodes: No enlarged mediastinal, hilar, or axillary lymph nodes. Thyroid gland, trachea, and esophagus demonstrate no significant findings. Lungs/Pleura: Dense consolidation within the right lower lobe and patchy foci of consolidation scattered throughout the lungs bilaterally. Moderate right and small left pleural effusions. No pneumothorax. Musculoskeletal: No chest wall mass or suspicious bone lesions identified. CT ABDOMEN PELVIS FINDINGS Hepatobiliary: No focal liver abnormality is seen. No gallstones, gallbladder wall thickening, or biliary dilatation. Pancreas: Unremarkable. No pancreatic ductal dilatation or surrounding inflammatory changes. Spleen: Normal in size without focal abnormality. Adrenals/Urinary Tract: Normal adrenal glands. Severe right kidney atrophy. No hydronephrosis. No focal left kidney lesion identified. Normal bladder. Stomach/Bowel: Stomach is within normal limits. Appendix appears normal. No evidence of bowel wall thickening, distention, or inflammatory changes. Vascular/Lymphatic: Aortic atherosclerosis. No enlarged abdominal or pelvic lymph nodes. Reproductive: Status post hysterectomy. No adnexal masses.  Other: Diastases rectus and several small hernias in the umbilical region containing fat. Musculoskeletal: No acute or significant osseous findings. IMPRESSION: 1. Multifocal pneumonia with dense consolidation in the right upper lobe along the major fissure. 2. Moderate right and small left pleural effusion. 3. Mild coronary artery calcific atherosclerosis. Aortic atherosclerosis. 4. Severe right kidney atrophy. 5. Diastases rectus and several small hernias containing fat in the umbilical region. Electronically Signed   By: Kristine Garbe M.D.   On: 11/14/2017 19:28   Dg Chest 2 View  Result Date: 11/14/2017 CLINICAL DATA:  2 day hx. Sob, chest upper right  chest pain radiating through to right upper posterior chest. Bilat. Arm pain. Hx DM, HTN EXAM: CHEST  2 VIEW COMPARISON:  None. FINDINGS: Heart is mildly enlarged. No pulmonary edema. There patchy airspace filling opacities within the right upper lobe, right lower lobe, and left upper lobe. There pleural changes bilaterally, consistent small effusions or thickening. Degenerative changes are seen in the shoulders bilaterally. There is atherosclerotic calcification of the abdominal aorta. IMPRESSION: 1. Multifocal pulmonary infiltrates. 2. Cardiomegaly without pulmonary edema. Electronically Signed   By: Nolon Nations M.D.   On: 11/14/2017 15:25   Ct Chest Wo Contrast  Result Date: 11/14/2017 CLINICAL DATA:  56 y/o  F; chest pain and left leg pain for 3 days. EXAM: CT CHEST, ABDOMEN AND PELVIS WITHOUT CONTRAST TECHNIQUE: Multidetector CT imaging of the chest, abdomen and pelvis was performed following the standard protocol without IV contrast. COMPARISON:  None. FINDINGS: CT CHEST FINDINGS Cardiovascular: No significant vascular findings. Normal heart size. No pericardial effusion. Normal caliber thoracic aorta. Moderate calcific atherosclerosis. Mild coronary artery calcification. Mediastinum/Nodes: No enlarged mediastinal, hilar, or axillary lymph nodes. Thyroid gland, trachea, and esophagus demonstrate no significant findings. Lungs/Pleura: Dense consolidation within the right lower lobe and patchy foci of consolidation scattered throughout the lungs bilaterally. Moderate right and small left pleural effusions. No pneumothorax. Musculoskeletal: No chest wall mass or suspicious bone lesions identified. CT ABDOMEN PELVIS FINDINGS Hepatobiliary: No focal liver abnormality is seen. No gallstones, gallbladder wall thickening, or biliary dilatation. Pancreas: Unremarkable. No pancreatic ductal dilatation or surrounding inflammatory changes. Spleen: Normal in size without focal abnormality. Adrenals/Urinary Tract:  Normal adrenal glands. Severe right kidney atrophy. No hydronephrosis. No focal left kidney lesion identified. Normal bladder. Stomach/Bowel: Stomach is within normal limits. Appendix appears normal. No evidence of bowel wall thickening, distention, or inflammatory changes. Vascular/Lymphatic: Aortic atherosclerosis. No enlarged abdominal or pelvic lymph nodes. Reproductive: Status post hysterectomy. No adnexal masses. Other: Diastases rectus and several small hernias in the umbilical region containing fat. Musculoskeletal: No acute or significant osseous findings. IMPRESSION: 1. Multifocal pneumonia with dense consolidation in the right upper lobe along the major fissure. 2. Moderate right and small left pleural effusion. 3. Mild coronary artery calcific atherosclerosis. Aortic atherosclerosis. 4. Severe right kidney atrophy. 5. Diastases rectus and several small hernias containing fat in the umbilical region. Electronically Signed   By: Kristine Garbe M.D.   On: 11/14/2017 19:28    Labs:  CBC: Recent Labs    11/14/17 1411  WBC 14.4*  HGB 6.7*  HCT 21.2*  PLT 290    COAGS: Recent Labs    11/14/17 1743  INR 1.18    BMP: Recent Labs    07/12/17 0900 09/06/17 1721 11/05/17 0908 11/14/17 1411  NA 144 142 145* 140  K 5.1 4.9 5.0 4.5  CL 107* 107* 113* 114*  CO2 17* 15* 16* 13*  GLUCOSE 86 122* 118*  58*  BUN 65* 66* 84* 80*  CALCIUM 8.4* 8.2* 7.4* 6.8*  CREATININE 6.82* 7.49* 9.23* 10.53*  GFRNONAA 6* 6* 4* 4*  GFRAA 7* 6* 5* 4*    LIVER FUNCTION TESTS: Recent Labs    07/12/17 0900 11/05/17 0908  BILITOT <0.2 0.2  AST 8 17  ALT 12 35*  ALKPHOS 326* 325*  PROT 7.3 7.0  ALBUMIN 4.1 4.0    TUMOR MARKERS: No results for input(s): AFPTM, CEA, CA199, CHROMGRNA in the last 8760 hours.  Assessment and Plan: AKI Pneumonia- low grade fever Tmax 99.9, WBC elevated at 14.4, imaging concerning for pneumonia. Placed on IV abx Will offer temp HD cath today, and  allow a few days of IV abx and overall clinical improvement, then plan conversion to Tunneled HD cath, likely Monday. Pt and son agreeable to plan. Risks and benefits discussed with the patient including, but not limited to bleeding, infection, pneumothorax, or fibrin sheath development and need for additional procedures. All of the patient's questions were answered, patient is agreeable to proceed. Consent signed and in chart.    Thank you for this interesting consult.  I greatly enjoyed meeting Rida [place] Basham and look forward to participating in their care.  A copy of this report was sent to the requesting provider on this date.  Electronically Signed: Ascencion Dike, PA-C 11/15/2017, 10:11 AM   I spent a total of 20 minutes in face to face in clinical consultation, greater than 50% of which was counseling/coordinating care for dialysis catheter placement

## 2017-11-15 NOTE — ED Notes (Signed)
Pt placed on hospital bed for comfort, update given to pt and family about holding

## 2017-11-15 NOTE — Progress Notes (Addendum)
Called about patient having bursts in HR into the 200s.  Per RN, patient goes from 90s-110s into the 200s, burst are short but frequent.  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 Dizziness started a few days ago.   SBP maintaining in 130s, HR was 90-110s, bursts into 200s when I arrived as well.  TRH paged, CMP/Mag/Lopressor 5mg  IV ordered.  Labs drawn, 2nd IV started, Lopressor given, HR stills bursts into 200s, TRH paged again and came to the bedside. TRH ordered 250cc NS bolus CARDS consulted, repeat EKG done showed SR in the 80s now. BPs still maintaining.   Plan: -- Lopressor 25mg  PO Q6H -- Inform primary service if HR > 140 sustained.   Start Time 2110 End Time 2230 ___________________________________________________________________   Called at 0005 for update, HR improved.  Called at [place] for update, HR still maintaining SR.   Call RRT if needed.

## 2017-11-15 NOTE — Progress Notes (Signed)
CBG this AM - 69; patient alert and oriented, asymptomatic. After 240 cc Orange Juice, CBG came up to 107. MD made aware. Will continue to monitor.

## 2017-11-15 NOTE — Progress Notes (Signed)
Pt arrived to unit. Identified appropriately and assessed. Skin intact. Alert and oriented x4. Placed on telemetry, oriented to room, call bell within reach. Pt's son is at the bedside. Will continue to monitor.

## 2017-11-16 ENCOUNTER — Ambulatory Visit: Payer: Self-pay | Admitting: Internal Medicine

## 2017-11-16 LAB — URINALYSIS, ROUTINE W REFLEX MICROSCOPIC
BILIRUBIN URINE: NEGATIVE
Glucose, UA: 50 mg/dL — AB
KETONES UR: 5 mg/dL — AB
NITRITE: NEGATIVE
Protein, ur: 100 mg/dL — AB
Specific Gravity, Urine: 1.004 — ABNORMAL LOW (ref 1.005–1.030)
pH: 8 (ref 5.0–8.0)

## 2017-11-16 LAB — GLUCOSE, CAPILLARY
GLUCOSE-CAPILLARY: 92 mg/dL (ref 65–99)
GLUCOSE-CAPILLARY: 111 mg/dL — AB (ref 65–99)
Glucose-Capillary: 160 mg/dL — ABNORMAL HIGH (ref 65–99)
Glucose-Capillary: 156 mg/dL — ABNORMAL HIGH (ref 65–99)

## 2017-11-16 LAB — CBC
HEMATOCRIT: 22.4 % — AB (ref 36.0–46.0)
HEMOGLOBIN: 7.3 g/dL — AB (ref 12.0–15.0)
MCH: 27.9 pg (ref 26.0–34.0)
MCHC: 32.6 g/dL (ref 30.0–36.0)
MCV: 85.5 fL (ref 78.0–100.0)
Platelets: 255 10*3/uL (ref 150–400)
RBC: 2.62 MIL/uL — ABNORMAL LOW (ref 3.87–5.11)
RDW: 15.5 % (ref 11.5–15.5)
WBC: 8.4 10*3/uL (ref 4.0–10.5)

## 2017-11-16 LAB — HEPATITIS B SURFACE ANTIGEN: HEP B S AG: NEGATIVE

## 2017-11-16 LAB — PREPARE RBC (CROSSMATCH)

## 2017-11-16 LAB — STREP PNEUMONIAE URINARY ANTIGEN: STREP PNEUMO URINARY ANTIGEN: NEGATIVE

## 2017-11-16 MED ORDER — SODIUM CHLORIDE 0.9 % IV SOLN
Freq: Once | INTRAVENOUS | Status: AC
  Administered 2017-11-16: 250 mL via INTRAVENOUS

## 2017-11-16 MED ORDER — AZITHROMYCIN 500 MG PO TABS
500.0000 mg | ORAL_TABLET | Freq: Every day | ORAL | Status: AC
  Administered 2017-11-17 – 2017-11-18 (×2): 500 mg via ORAL
  Filled 2017-11-16 (×2): qty 1

## 2017-11-16 MED ORDER — SODIUM CHLORIDE 0.9 % IV SOLN
125.0000 mg | INTRAVENOUS | Status: DC
  Administered 2017-11-19 – 2017-11-21 (×2): 125 mg via INTRAVENOUS
  Filled 2017-11-16 (×5): qty 10

## 2017-11-16 MED ORDER — METOPROLOL TARTRATE 25 MG PO TABS
25.0000 mg | ORAL_TABLET | Freq: Two times a day (BID) | ORAL | Status: DC
  Administered 2017-11-16 – 2017-11-22 (×13): 25 mg via ORAL
  Filled 2017-11-16 (×14): qty 1

## 2017-11-16 NOTE — Progress Notes (Signed)
Assessment/Plan: 1. ESRD, new start  2. ? PNA - started on std CAP Abx. 3. pSVT post HD 4 Anemia with iron defic 5 Atrophic right kidney  Plan HD today, IV iron, vein mapping and will need VVS to see  Subjective: Interval History: feels better, 1500cc removed with HD  Objective: Vital signs in last 24 hours: Temp:  [98 F (36.7 C)-99.5 F (37.5 C)] 98.7 F (37.1 C) (11/30 1512) Pulse Rate:  [75-125] 75 (11/30 1512) Resp:  [16-24] 18 (11/30 1512) BP: (107-151)/(52-77) 130/69 (11/30 1512) SpO2:  [91 %-99 %] 99 % (11/30 1512) Weight:  [59.9 kg (132 lb 0.9 oz)] 59.9 kg (132 lb 0.9 oz) (11/29 1845) Weight change: 0 kg (0 lb)  Intake/Output from previous day: 11/29 0701 - 11/30 0700 In: 710 [P.O.:160; IV Piggyback:550] Out: 7209 [Urine:650] Intake/Output this shift: Total I/O In: 794 [P.O.:60; Blood:434; IV OBSJGGEZM:629] Out: 602 [Urine:601; Stool:1]  General appearance: alert, cooperative and more comfortable appearing Resp: clear to auscultation bilaterally Chest wall: no tenderness Cardio: regular rate and rhythm, S1, S2 normal, no murmur, click, rub or gallop Extremities: extremities normal, atraumatic, no cyanosis or edema  Lab Results: Recent Labs    11/15/17 1630 11/16/17 0406  WBC 9.0 8.4  HGB 7.4* 7.3*  HCT 22.5* 22.4*  PLT 278 255   BMET:  Recent Labs    11/15/17 1630 11/15/17 2152  NA 140 136  K 4.0 4.0  CL 114* 104  CO2 14* 20*  GLUCOSE 94 125*  BUN 85* 37*  CREATININE 10.56* 5.79*  CALCIUM 6.3* 7.2*   No results for input(s): PTH in the last 72 hours. Iron Studies:  Recent Labs    11/15/17 0838  IRON 29  TIBC 249*   Studies/Results: Ct Abdomen Pelvis Wo Contrast  Result Date: 11/14/2017 CLINICAL DATA:  56 y/o  F; chest pain and left leg pain for 3 days. EXAM: CT CHEST, ABDOMEN AND PELVIS WITHOUT CONTRAST TECHNIQUE: Multidetector CT imaging of the chest, abdomen and pelvis was performed following the standard protocol without IV  contrast. COMPARISON:  None. FINDINGS: CT CHEST FINDINGS Cardiovascular: No significant vascular findings. Normal heart size. No pericardial effusion. Normal caliber thoracic aorta. Moderate calcific atherosclerosis. Mild coronary artery calcification. Mediastinum/Nodes: No enlarged mediastinal, hilar, or axillary lymph nodes. Thyroid gland, trachea, and esophagus demonstrate no significant findings. Lungs/Pleura: Dense consolidation within the right lower lobe and patchy foci of consolidation scattered throughout the lungs bilaterally. Moderate right and small left pleural effusions. No pneumothorax. Musculoskeletal: No chest wall mass or suspicious bone lesions identified. CT ABDOMEN PELVIS FINDINGS Hepatobiliary: No focal liver abnormality is seen. No gallstones, gallbladder wall thickening, or biliary dilatation. Pancreas: Unremarkable. No pancreatic ductal dilatation or surrounding inflammatory changes. Spleen: Normal in size without focal abnormality. Adrenals/Urinary Tract: Normal adrenal glands. Severe right kidney atrophy. No hydronephrosis. No focal left kidney lesion identified. Normal bladder. Stomach/Bowel: Stomach is within normal limits. Appendix appears normal. No evidence of bowel wall thickening, distention, or inflammatory changes. Vascular/Lymphatic: Aortic atherosclerosis. No enlarged abdominal or pelvic lymph nodes. Reproductive: Status post hysterectomy. No adnexal masses. Other: Diastases rectus and several small hernias in the umbilical region containing fat. Musculoskeletal: No acute or significant osseous findings. IMPRESSION: 1. Multifocal pneumonia with dense consolidation in the right upper lobe along the major fissure. 2. Moderate right and small left pleural effusion. 3. Mild coronary artery calcific atherosclerosis. Aortic atherosclerosis. 4. Severe right kidney atrophy. 5. Diastases rectus and several small hernias containing fat in the umbilical region. Electronically  Signed   By:  Kristine Garbe M.D.   On: 11/14/2017 19:28   Ct Chest Wo Contrast  Result Date: 11/14/2017 CLINICAL DATA:  56 y/o  F; chest pain and left leg pain for 3 days. EXAM: CT CHEST, ABDOMEN AND PELVIS WITHOUT CONTRAST TECHNIQUE: Multidetector CT imaging of the chest, abdomen and pelvis was performed following the standard protocol without IV contrast. COMPARISON:  None. FINDINGS: CT CHEST FINDINGS Cardiovascular: No significant vascular findings. Normal heart size. No pericardial effusion. Normal caliber thoracic aorta. Moderate calcific atherosclerosis. Mild coronary artery calcification. Mediastinum/Nodes: No enlarged mediastinal, hilar, or axillary lymph nodes. Thyroid gland, trachea, and esophagus demonstrate no significant findings. Lungs/Pleura: Dense consolidation within the right lower lobe and patchy foci of consolidation scattered throughout the lungs bilaterally. Moderate right and small left pleural effusions. No pneumothorax. Musculoskeletal: No chest wall mass or suspicious bone lesions identified. CT ABDOMEN PELVIS FINDINGS Hepatobiliary: No focal liver abnormality is seen. No gallstones, gallbladder wall thickening, or biliary dilatation. Pancreas: Unremarkable. No pancreatic ductal dilatation or surrounding inflammatory changes. Spleen: Normal in size without focal abnormality. Adrenals/Urinary Tract: Normal adrenal glands. Severe right kidney atrophy. No hydronephrosis. No focal left kidney lesion identified. Normal bladder. Stomach/Bowel: Stomach is within normal limits. Appendix appears normal. No evidence of bowel wall thickening, distention, or inflammatory changes. Vascular/Lymphatic: Aortic atherosclerosis. No enlarged abdominal or pelvic lymph nodes. Reproductive: Status post hysterectomy. No adnexal masses. Other: Diastases rectus and several small hernias in the umbilical region containing fat. Musculoskeletal: No acute or significant osseous findings. IMPRESSION: 1. Multifocal  pneumonia with dense consolidation in the right upper lobe along the major fissure. 2. Moderate right and small left pleural effusion. 3. Mild coronary artery calcific atherosclerosis. Aortic atherosclerosis. 4. Severe right kidney atrophy. 5. Diastases rectus and several small hernias containing fat in the umbilical region. Electronically Signed   By: Kristine Garbe M.D.   On: 11/14/2017 19:28   Ir Fluoro Guide Cv Line Right  Result Date: 11/15/2017 INDICATION: 56 year old female with end-stage renal disease and active pneumonia. She requires a temporary hemodialysis catheter for dialysis. This catheter will then be converted to a tunneled hemodialysis catheter following resolution of her acute infection. EXAM: TUNNELED CENTRAL VENOUS HEMODIALYSIS CATHETER PLACEMENT WITH ULTRASOUND AND FLUOROSCOPIC GUIDANCE MEDICATIONS: None ANESTHESIA/SEDATION: None FLUOROSCOPY TIME:  Fluoroscopy Time: 0 minutes 18 seconds (5 mGy). COMPLICATIONS: None immediate. PROCEDURE: Informed written consent was obtained from the patient after a discussion of the risks, benefits, and alternatives to treatment. Questions regarding the procedure were encouraged and answered. The right neck and chest were prepped with chlorhexidine in a sterile fashion, and a sterile drape was applied covering the operative field. Maximum barrier sterile technique with sterile gowns and gloves were used for the procedure. A timeout was performed prior to the initiation of the procedure. After creating a small venotomy incision, a micropuncture kit was utilized to access the right internal jugular vein under direct, real-time ultrasound guidance after the overlying soft tissues were anesthetized with 1% lidocaine with epinephrine. Ultrasound image documentation was performed. The microwire was kinked to measure appropriate catheter length. A J wire was advanced to the level of the IVC and the micropuncture sheath was exchanged for a peel-away  sheath. A 20 cm Mahurkar hemodialysis catheter was then placed through the peel-away sheath with tips ultimately positioned within the superior aspect of the right atrium. Final catheter positioning was confirmed and documented with a spot radiographic image. The catheter aspirates and flushes normally. The catheter was flushed with appropriate  volume heparin dwells. The catheter exit site was secured with a 0-Prolene retention suture. The patient tolerated the procedure well without immediate post procedural complication. IMPRESSION: Successful placement of 20 cm Mahurkar Trialysis catheter via the right internal jugular vein with tips terminating within the superior aspect of the right atrium. The catheter is ready for immediate use. Electronically Signed   By: Jacqulynn Cadet M.D.   On: 11/15/2017 14:20   Ir US Guide Vasc Access Right  Result Date: 11/15/2017 INDICATION: 56 year old female with end-stage renal disease and active pneumonia. She requires a temporary hemodialysis catheter for dialysis. This catheter will then be converted to a tunneled hemodialysis catheter following resolution of her acute infection. EXAM: TUNNELED CENTRAL VENOUS HEMODIALYSIS CATHETER PLACEMENT WITH ULTRASOUND AND FLUOROSCOPIC GUIDANCE MEDICATIONS: None ANESTHESIA/SEDATION: None FLUOROSCOPY TIME:  Fluoroscopy Time: 0 minutes 18 seconds (5 mGy). COMPLICATIONS: None immediate. PROCEDURE: Informed written consent was obtained from the patient after a discussion of the risks, benefits, and alternatives to treatment. Questions regarding the procedure were encouraged and answered. The right neck and chest were prepped with chlorhexidine in a sterile fashion, and a sterile drape was applied covering the operative field. Maximum barrier sterile technique with sterile gowns and gloves were used for the procedure. A timeout was performed prior to the initiation of the procedure. After creating a small venotomy incision, a  micropuncture kit was utilized to access the right internal jugular vein under direct, real-time ultrasound guidance after the overlying soft tissues were anesthetized with 1% lidocaine with epinephrine. Ultrasound image documentation was performed. The microwire was kinked to measure appropriate catheter length. A J wire was advanced to the level of the IVC and the micropuncture sheath was exchanged for a peel-away sheath. A 20 cm Mahurkar hemodialysis catheter was then placed through the peel-away sheath with tips ultimately positioned within the superior aspect of the right atrium. Final catheter positioning was confirmed and documented with a spot radiographic image. The catheter aspirates and flushes normally. The catheter was flushed with appropriate volume heparin dwells. The catheter exit site was secured with a 0-Prolene retention suture. The patient tolerated the procedure well without immediate post procedural complication. IMPRESSION: Successful placement of 20 cm Mahurkar Trialysis catheter via the right internal jugular vein with tips terminating within the superior aspect of the right atrium. The catheter is ready for immediate use. Electronically Signed   By: Jacqulynn Cadet M.D.   On: 11/15/2017 14:20    Scheduled: . atorvastatin  40 mg Oral QPM  . [START ON 11/17/2017] azithromycin  500 mg Oral Daily  . enoxaparin (LOVENOX) injection  30 mg Subcutaneous QHS  . fluticasone  1 spray Each Nare Daily  . insulin aspart  0-5 Units Subcutaneous QHS  . insulin aspart  0-9 Units Subcutaneous TID WC  . metoprolol tartrate  25 mg Oral BID  . sodium chloride flush  3 mL Intravenous Q12H    LOS: 2 days   Estanislado Emms 11/16/2017,5:20 PM

## 2017-11-16 NOTE — Progress Notes (Signed)
PROGRESS NOTE    [name]  KPV:[id number] DOB: [birth year] DOA: 11/14/2017 PCP: Mack Hook, MD  Brief Narrative:  [name] is an 56 y.o. female with DM. Blindness,  CKDV nearing dialysis who had been refusing dialysis followed by [facility] Dr. Justin Mend. She is p/w dyspnea, cough but denies myalgias, rigors, fevers. CT chest with multiple pulmonary infiltrrates present and cardiomegaly without pulmonary edema on CXR. creatinine is 10, bicarbonate is 13 Renal consulting, plan for HD catheter to start HD, pt agrees now  Assessment & Plan:   Principal Problem:   Multifocal pneumonia -continue Coverage with ceftriaxone and azithromycin -cT chest notes consolidation not pulmonary edema -On day 3 of ceftriaxone and azithromycin -FU blood and sputum cultures-no growth -follow-up chest x-ray in 4-6 weeks    Progressive CKD5/metabolic acidosis -Now ESRD -presenting with a creatinine of 10 and bicarbonate of 13 -Finally agreeable to start hemodialysis, renal consulting, IR consulted, status post temporary HD catheter 11/29 -Status post first hemodialysis treatment yesterday 11/29 -Diet education -Clip for outpatient HD    Diabetes mellitus  -glipizide on hold -Continue sliding scale insulin -HbA1c in August was 7.3, Will repeat    Essential hypertension -will hold amlodipine due to expected hypotension after starting HD   Anemia of chronic disease -And iron deficiency noted on anemia panel -No overt blood loss noted -Status post 1 unit PRBC on admission, will give 1 more unit of blood today -Needs iron and EPO with dialysis per renal  DVT prophylaxis: lovenox Code Status: Full Code Family Communication: Son at bedside Disposition Plan: Home when stable  Consultants:   Renal   Procedures:   Antimicrobials:  Rocephin/zithromax 11/18  Subjective: -Tolerated first dialysis treatment, breathing improving  Objective: Vitals:   11/16/17 0602  11/16/17 0603 11/16/17 1039 11/16/17 1110  BP: 133/63 133/63 125/65 (!) 115/52  Pulse: 82 82 77 76  Resp: _0 Temp: 99.5 F (37.5 C) 99.5 F (37.5 C) 98.2 F (36.8 C) 98.5 F (36.9 C)  TempSrc: Oral Oral Oral Oral  SpO2: 91% 91% 95% 96%  Weight:      Height:        Intake/Output Summary (Last 24 hours) at 11/16/2017 1404 Last data filed at 11/16/2017 1101 Gross per 24 hour  Intake 580 ml  Output 1652 ml  Net -1072 ml   Filed Weights   11/15/17 0514 11/15/17 1545 11/15/17 1845  Weight: 60.6 kg (133 lb 9.6 oz) 60.6 kg (133 lb 9.6 oz) 59.9 kg (132 lb 0.9 oz)    Examination:  Gen: Awake, Alert, Oriented X 3, pleasant, blind female HEENT: PERRLA, Neck supple, no JVD Lungs: Few rhonchi at the bases CVS: RRR,No Gallops,Rubs or new Murmurs Abd: soft, Non tender, non distended, BS present Extremities: Trace edema Skin: no new rashes    Data Reviewed:   CBC: Recent Labs  Lab 11/14/17 1411 11/15/17 1414 11/15/17 1630 11/16/17 0406  WBC 14.4* 8.8 9.0 8.4  HGB 6.7* 8.1* 7.4* 7.3*  HCT 21.2* 24.7* 22.5* 22.4*  MCV 89.8 86.1 85.6 85.5  PLT 290 273 278 675   Basic Metabolic Panel: Recent Labs  Lab 11/14/17 1411 11/15/17 1630 11/15/17 2152  NA 140 140 136  K 4.5 4.0 4.0  CL 114* 114* 104  CO2 13* 14* 20*  GLUCOSE 58* 94 125*  BUN 80* 85* 37*  CREATININE 10.53* 10.56* 5.79*  CALCIUM 6.8* 6.3* 7.2*  MG  --   --  2.1  PHOS  --  5.3*  --    GFR: Estimated Creatinine Clearance: 9.1 mL/min (A) (by C-G formula based on SCr of 5.79 mg/dL (H)). Liver Function Tests: Recent Labs  Lab 11/15/17 1630 11/15/17 2152  AST  --  40  ALT  --  34  ALKPHOS  --  243*  BILITOT  --  1.7*  PROT  --  6.2*  ALBUMIN 2.8* 2.6*   No results for input(s): LIPASE, AMYLASE in the last 168 hours. No results for input(s): AMMONIA in the last 168 hours. Coagulation Profile: Recent Labs  Lab 11/14/17 1743  INR 1.18   Cardiac Enzymes: No results for input(s): CKTOTAL,  CKMB, CKMBINDEX, TROPONINI in the last 168 hours. BNP (last 3 results) No results for input(s): PROBNP in the last 8760 hours. HbA1C: No results for input(s): HGBA1C in the last 72 hours. CBG: Recent Labs  Lab 11/15/17 0842 11/15/17 1204 11/15/17 2139 11/16/17 0745 11/16/17 1205  GLUCAP 107* 156* 118* 92 160*   Lipid Profile: No results for input(s): CHOL, HDL, LDLCALC, TRIG, CHOLHDL, LDLDIRECT in the last 72 hours. Thyroid Function Tests: No results for input(s): TSH, T4TOTAL, FREET4, T3FREE, THYROIDAB in the last 72 hours. Anemia Panel: Recent Labs    11/15/17 0838  TIBC 249*  IRON 29   Urine analysis:    Component Value Date/Time   COLORURINE STRAW (A) 11/16/2017 0301   APPEARANCEUR CLEAR 11/16/2017 0301   LABSPEC 1.004 (L) 11/16/2017 0301   PHURINE 8.0 11/16/2017 0301   GLUCOSEU 50 (A) 11/16/2017 0301   HGBUR SMALL (A) 11/16/2017 0301   BILIRUBINUR NEGATIVE 11/16/2017 0301   KETONESUR 5 (A) 11/16/2017 0301   PROTEINUR 100 (A) 11/16/2017 0301   UROBILINOGEN 0.2 10/18/2011 1003   NITRITE NEGATIVE 11/16/2017 0301   LEUKOCYTESUR MODERATE (A) 11/16/2017 0301   Sepsis Labs: _0 (procalcitonin:4,lacticidven:4)  ) Recent Results (from the past 240 hour(s))  Culture, blood (routine x 2)     Status: None (Preliminary result)   Collection Time: 11/14/17  5:53 PM  Result Value Ref Range Status   Specimen Description BLOOD LEFT HAND  Final   Special Requests   Final    Blood Culture results may not be optimal due to an inadequate volume of blood received in culture bottles BOTTLES DRAWN AEROBIC AND ANAEROBIC   Culture NO GROWTH < 24 HOURS  Final   Report Status PENDING  Incomplete  Culture, blood (routine x 2)     Status: None (Preliminary result)   Collection Time: 11/14/17  6:37 PM  Result Value Ref Range Status   Specimen Description BLOOD RIGHT ANTECUBITAL  Final   Special Requests   Final    Blood Culture adequate volume BOTTLES DRAWN AEROBIC AND  ANAEROBIC   Culture NO GROWTH < 24 HOURS  Final   Report Status PENDING  Incomplete         Radiology Studies: Ct Abdomen Pelvis Wo Contrast  Result Date: 11/14/2017 CLINICAL DATA:  56 y/o  F; chest pain and left leg pain for 3 days. EXAM: CT CHEST, ABDOMEN AND PELVIS WITHOUT CONTRAST TECHNIQUE: Multidetector CT imaging of the chest, abdomen and pelvis was performed following the standard protocol without IV contrast. COMPARISON:  None. FINDINGS: CT CHEST FINDINGS Cardiovascular: No significant vascular findings. Normal heart size. No pericardial effusion. Normal caliber thoracic aorta. Moderate calcific atherosclerosis. Mild coronary artery calcification. Mediastinum/Nodes: No enlarged mediastinal, hilar, or axillary lymph nodes. Thyroid gland, trachea, and esophagus demonstrate no significant findings. Lungs/Pleura: Dense consolidation within the right lower lobe and  patchy foci of consolidation scattered throughout the lungs bilaterally. Moderate right and small left pleural effusions. No pneumothorax. Musculoskeletal: No chest wall mass or suspicious bone lesions identified. CT ABDOMEN PELVIS FINDINGS Hepatobiliary: No focal liver abnormality is seen. No gallstones, gallbladder wall thickening, or biliary dilatation. Pancreas: Unremarkable. No pancreatic ductal dilatation or surrounding inflammatory changes. Spleen: Normal in size without focal abnormality. Adrenals/Urinary Tract: Normal adrenal glands. Severe right kidney atrophy. No hydronephrosis. No focal left kidney lesion identified. Normal bladder. Stomach/Bowel: Stomach is within normal limits. Appendix appears normal. No evidence of bowel wall thickening, distention, or inflammatory changes. Vascular/Lymphatic: Aortic atherosclerosis. No enlarged abdominal or pelvic lymph nodes. Reproductive: Status post hysterectomy. No adnexal masses. Other: Diastases rectus and several small hernias in the umbilical region containing fat.  Musculoskeletal: No acute or significant osseous findings. IMPRESSION: 1. Multifocal pneumonia with dense consolidation in the right upper lobe along the major fissure. 2. Moderate right and small left pleural effusion. 3. Mild coronary artery calcific atherosclerosis. Aortic atherosclerosis. 4. Severe right kidney atrophy. 5. Diastases rectus and several small hernias containing fat in the umbilical region. Electronically Signed   By: Kristine Garbe M.D.   On: 11/14/2017 19:28   Dg Chest 2 View  Result Date: 11/14/2017 CLINICAL DATA:  2 day hx. Sob, chest upper right chest pain radiating through to right upper posterior chest. Bilat. Arm pain. Hx DM, HTN EXAM: CHEST  2 VIEW COMPARISON:  None. FINDINGS: Heart is mildly enlarged. No pulmonary edema. There patchy airspace filling opacities within the right upper lobe, right lower lobe, and left upper lobe. There pleural changes bilaterally, consistent small effusions or thickening. Degenerative changes are seen in the shoulders bilaterally. There is atherosclerotic calcification of the abdominal aorta. IMPRESSION: 1. Multifocal pulmonary infiltrates. 2. Cardiomegaly without pulmonary edema. Electronically Signed   By: Nolon Nations M.D.   On: 11/14/2017 15:25   Ct Chest Wo Contrast  Result Date: 11/14/2017 CLINICAL DATA:  56 y/o  F; chest pain and left leg pain for 3 days. EXAM: CT CHEST, ABDOMEN AND PELVIS WITHOUT CONTRAST TECHNIQUE: Multidetector CT imaging of the chest, abdomen and pelvis was performed following the standard protocol without IV contrast. COMPARISON:  None. FINDINGS: CT CHEST FINDINGS Cardiovascular: No significant vascular findings. Normal heart size. No pericardial effusion. Normal caliber thoracic aorta. Moderate calcific atherosclerosis. Mild coronary artery calcification. Mediastinum/Nodes: No enlarged mediastinal, hilar, or axillary lymph nodes. Thyroid gland, trachea, and esophagus demonstrate no significant findings.  Lungs/Pleura: Dense consolidation within the right lower lobe and patchy foci of consolidation scattered throughout the lungs bilaterally. Moderate right and small left pleural effusions. No pneumothorax. Musculoskeletal: No chest wall mass or suspicious bone lesions identified. CT ABDOMEN PELVIS FINDINGS Hepatobiliary: No focal liver abnormality is seen. No gallstones, gallbladder wall thickening, or biliary dilatation. Pancreas: Unremarkable. No pancreatic ductal dilatation or surrounding inflammatory changes. Spleen: Normal in size without focal abnormality. Adrenals/Urinary Tract: Normal adrenal glands. Severe right kidney atrophy. No hydronephrosis. No focal left kidney lesion identified. Normal bladder. Stomach/Bowel: Stomach is within normal limits. Appendix appears normal. No evidence of bowel wall thickening, distention, or inflammatory changes. Vascular/Lymphatic: Aortic atherosclerosis. No enlarged abdominal or pelvic lymph nodes. Reproductive: Status post hysterectomy. No adnexal masses. Other: Diastases rectus and several small hernias in the umbilical region containing fat. Musculoskeletal: No acute or significant osseous findings. IMPRESSION: 1. Multifocal pneumonia with dense consolidation in the right upper lobe along the major fissure. 2. Moderate right and small left pleural effusion. 3. Mild coronary artery calcific atherosclerosis.  Aortic atherosclerosis. 4. Severe right kidney atrophy. 5. Diastases rectus and several small hernias containing fat in the umbilical region. Electronically Signed   By: Kristine Garbe M.D.   On: 11/14/2017 19:28   Ir Fluoro Guide Cv Line Right  Result Date: 11/15/2017 INDICATION: 56 year old female with end-stage renal disease and active pneumonia. She requires a temporary hemodialysis catheter for dialysis. This catheter will then be converted to a tunneled hemodialysis catheter following resolution of her acute infection. EXAM: TUNNELED CENTRAL  VENOUS HEMODIALYSIS CATHETER PLACEMENT WITH ULTRASOUND AND FLUOROSCOPIC GUIDANCE MEDICATIONS: None ANESTHESIA/SEDATION: None FLUOROSCOPY TIME:  Fluoroscopy Time: 0 minutes 18 seconds (5 mGy). COMPLICATIONS: None immediate. PROCEDURE: Informed written consent was obtained from the patient after a discussion of the risks, benefits, and alternatives to treatment. Questions regarding the procedure were encouraged and answered. The right neck and chest were prepped with chlorhexidine in a sterile fashion, and a sterile drape was applied covering the operative field. Maximum barrier sterile technique with sterile gowns and gloves were used for the procedure. A timeout was performed prior to the initiation of the procedure. After creating a small venotomy incision, a micropuncture kit was utilized to access the right internal jugular vein under direct, real-time ultrasound guidance after the overlying soft tissues were anesthetized with 1% lidocaine with epinephrine. Ultrasound image documentation was performed. The microwire was kinked to measure appropriate catheter length. A J wire was advanced to the level of the IVC and the micropuncture sheath was exchanged for a peel-away sheath. A 20 cm Mahurkar hemodialysis catheter was then placed through the peel-away sheath with tips ultimately positioned within the superior aspect of the right atrium. Final catheter positioning was confirmed and documented with a spot radiographic image. The catheter aspirates and flushes normally. The catheter was flushed with appropriate volume heparin dwells. The catheter exit site was secured with a 0-Prolene retention suture. The patient tolerated the procedure well without immediate post procedural complication. IMPRESSION: Successful placement of 20 cm Mahurkar Trialysis catheter via the right internal jugular vein with tips terminating within the superior aspect of the right atrium. The catheter is ready for immediate use.  Electronically Signed   By: Jacqulynn Cadet M.D.   On: 11/15/2017 14:20   Ir US Guide Vasc Access Right  Result Date: 11/15/2017 INDICATION: 56 year old female with end-stage renal disease and active pneumonia. She requires a temporary hemodialysis catheter for dialysis. This catheter will then be converted to a tunneled hemodialysis catheter following resolution of her acute infection. EXAM: TUNNELED CENTRAL VENOUS HEMODIALYSIS CATHETER PLACEMENT WITH ULTRASOUND AND FLUOROSCOPIC GUIDANCE MEDICATIONS: None ANESTHESIA/SEDATION: None FLUOROSCOPY TIME:  Fluoroscopy Time: 0 minutes 18 seconds (5 mGy). COMPLICATIONS: None immediate. PROCEDURE: Informed written consent was obtained from the patient after a discussion of the risks, benefits, and alternatives to treatment. Questions regarding the procedure were encouraged and answered. The right neck and chest were prepped with chlorhexidine in a sterile fashion, and a sterile drape was applied covering the operative field. Maximum barrier sterile technique with sterile gowns and gloves were used for the procedure. A timeout was performed prior to the initiation of the procedure. After creating a small venotomy incision, a micropuncture kit was utilized to access the right internal jugular vein under direct, real-time ultrasound guidance after the overlying soft tissues were anesthetized with 1% lidocaine with epinephrine. Ultrasound image documentation was performed. The microwire was kinked to measure appropriate catheter length. A J wire was advanced to the level of the IVC and the micropuncture sheath was exchanged for  a peel-away sheath. A 20 cm Mahurkar hemodialysis catheter was then placed through the peel-away sheath with tips ultimately positioned within the superior aspect of the right atrium. Final catheter positioning was confirmed and documented with a spot radiographic image. The catheter aspirates and flushes normally. The catheter was flushed with  appropriate volume heparin dwells. The catheter exit site was secured with a 0-Prolene retention suture. The patient tolerated the procedure well without immediate post procedural complication. IMPRESSION: Successful placement of 20 cm Mahurkar Trialysis catheter via the right internal jugular vein with tips terminating within the superior aspect of the right atrium. The catheter is ready for immediate use. Electronically Signed   By: Jacqulynn Cadet M.D.   On: 11/15/2017 14:20        Scheduled Meds: . atorvastatin  40 mg Oral QPM  . [START ON 11/17/2017] azithromycin  500 mg Oral Daily  . enoxaparin (LOVENOX) injection  30 mg Subcutaneous QHS  . fluticasone  1 spray Each Nare Daily  . insulin aspart  0-5 Units Subcutaneous QHS  . insulin aspart  0-9 Units Subcutaneous TID WC  . metoprolol tartrate  25 mg Oral BID  . sodium chloride flush  3 mL Intravenous Q12H   Continuous Infusions: . sodium chloride    . sodium chloride    . sodium chloride    . cefTRIAXone (ROCEPHIN)  IV Stopped (11/16/17 1009)  . [START ON 11/19/2017] ferric gluconate (FERRLECIT/NULECIT) IV       LOS: 2 days    Time spent: 32mn    PDomenic Polite MD [facility] Page via [email or web address], password [email or web address] After 7PM please contact night-coverage  11/16/2017, 2:04 PM

## 2017-11-17 ENCOUNTER — Encounter (HOSPITAL_COMMUNITY): Payer: Self-pay

## 2017-11-17 ENCOUNTER — Inpatient Hospital Stay (HOSPITAL_COMMUNITY): Payer: Self-pay

## 2017-11-17 LAB — CBC
HCT: 29.4 % — ABNORMAL LOW (ref 36.0–46.0)
Hemoglobin: 9.8 g/dL — ABNORMAL LOW (ref 12.0–15.0)
MCH: 28.2 pg (ref 26.0–34.0)
MCHC: 33.3 g/dL (ref 30.0–36.0)
MCV: 84.7 fL (ref 78.0–100.0)
PLATELETS: 255 10*3/uL (ref 150–400)
RBC: 3.47 MIL/uL — ABNORMAL LOW (ref 3.87–5.11)
RDW: 15 % (ref 11.5–15.5)
WBC: 8.2 10*3/uL (ref 4.0–10.5)

## 2017-11-17 LAB — URINE CULTURE

## 2017-11-17 LAB — BASIC METABOLIC PANEL
ANION GAP: 12 (ref 5–15)
BUN: 51 mg/dL — AB (ref 6–20)
CO2: 21 mmol/L — ABNORMAL LOW (ref 22–32)
Calcium: 6.5 mg/dL — ABNORMAL LOW (ref 8.9–10.3)
Chloride: 107 mmol/L (ref 101–111)
Creatinine, Ser: 7.13 mg/dL — ABNORMAL HIGH (ref 0.44–1.00)
GFR calc Af Amer: 7 mL/min — ABNORMAL LOW (ref >60)
GFR calc non Af Amer: 6 mL/min — ABNORMAL LOW (ref >60)
GLUCOSE: 120 mg/dL — AB (ref 65–99)
POTASSIUM: 3.3 mmol/L — AB (ref 3.5–5.1)
SODIUM: 140 mmol/L (ref 135–145)

## 2017-11-17 LAB — GLUCOSE, CAPILLARY
GLUCOSE-CAPILLARY: 78 mg/dL (ref 65–99)
Glucose-Capillary: 98 mg/dL (ref 65–99)
Glucose-Capillary: 81 mg/dL (ref 65–99)

## 2017-11-17 NOTE — Progress Notes (Signed)
Pt transported to dialysis by transporter, Richard. Report given earlier to dialysis nurse. Ranelle Oyster, RN

## 2017-11-17 NOTE — Procedures (Signed)
Tolerating second HD trreatment.  No hemodynamic issues. Catheter functioning Lavina

## 2017-11-17 NOTE — Progress Notes (Signed)
PROGRESS NOTE    [name]  NAT:[id number] DOB: 1961/07/21 DOA: 11/14/2017 PCP: Mack Hook, MD  Brief Narrative:  [name] is an 56 y.o. female with DM. Blindness,  CKDV nearing dialysis who had been refusing dialysis followed by [facility] Dr. Justin Mend. She is p/w dyspnea, cough but denies myalgias, rigors, fevers. CT chest with multiple pulmonary infiltrrates present and cardiomegaly without pulmonary edema on CXR. creatinine is 10, bicarbonate is 13 Renal consulting, started HD this admission  Assessment & Plan:   Principal Problem:   Multifocal pneumonia -continue Coverage with ceftriaxone and azithromycin -cT chest notes consolidation not pulmonary edema -On day 4 of ceftriaxone and azithromycin, change to PO Abx tomorrow -FU blood and sputum cultures-no growth -follow-up chest x-ray in 4-6 weeks    Progressive CKD5/metabolic acidosis -Now ESRD -presenting with a creatinine of 10 and bicarbonate of 13 -Finally agreeable to start hemodialysis, renal consulting, IR consulted, status post temporary HD catheter 11/29 -Status post first hemodialysis treatment 11/29, HD again today -Diet education -Clip for outpatient HD started -Per Renal    Diabetes mellitus  -glipizide on hold -Continue sliding scale insulin -HbA1c in August was 7.3, Will repeat    Essential hypertension -norvasc on hold, BP stable   Anemia of chronic disease -And iron deficiency noted on anemia panel -No overt blood loss noted -Status post  2units PRBC this admission -Needs iron and EPO with dialysis per renal  DVT prophylaxis: lovenox Code Status: Full Code Family Communication:  None at bedside, seen on HD  Disposition Plan: Home when stable, CLIPPED for HD  Consultants:   Renal   Procedures:   Antimicrobials:  Rocephin/zithromax 11/18  Subjective: -feels ok, tolerated HD yesterday, no N/V, breathing improving  Objective: Vitals:   11/17/17 1000 11/17/17  1030 11/17/17 1043 11/17/17 1200  BP: 136/75 (!) 141/86 (!) 143/80   Pulse: 77 80 77   Resp: 14 13 14    Temp:   98.1 F (36.7 C)   TempSrc:   Oral   SpO2: 100% 100% 100% 98%  Weight:      Height:        Intake/Output Summary (Last 24 hours) at 11/17/2017 1434 Last data filed at 11/17/2017 1043 Gross per 24 hour  Intake 60 ml  Output 2800 ml  Net -2740 ml   Filed Weights   11/15/17 1545 11/15/17 1845 11/17/17 0734  Weight: 60.6 kg (133 lb 9.6 oz) 59.9 kg (132 lb 0.9 oz) 59 kg (130 lb 1.1 oz)    Examination: Seen on HD, no distress Gen: Awake, Alert, Oriented X 3, pleasant, blind female HEENT: PERRLA, Neck supple, no JVD Lungs: diminished BS at bases, rest clear CVS: S!S2/RRR Abd: soft, Non tender, non distended, BS present Extremities: no edema Skin: no new rashes    Data Reviewed:   CBC: Recent Labs  Lab 11/14/17 1411 11/15/17 1414 11/15/17 1630 11/16/17 0406 11/17/17 0243  WBC 14.4* 8.8 9.0 8.4 8.2  HGB 6.7* 8.1* 7.4* 7.3* 9.8*  HCT 21.2* 24.7* 22.5* 22.4* 29.4*  MCV 89.8 86.1 85.6 85.5 84.7  PLT 290 273 278 255 427   Basic Metabolic Panel: Recent Labs  Lab 11/14/17 1411 11/15/17 1630 11/15/17 2152 11/17/17 0243  NA 140 140 136 140  K 4.5 4.0 4.0 3.3*  CL 114* 114* 104 107  CO2 13* 14* 20* 21*  GLUCOSE 58* 94 125* 120*  BUN 80* 85* 37* 51*  CREATININE 10.53* 10.56* 5.79* 7.13*  CALCIUM 6.8* 6.3* 7.2*  6.5*  MG  --   --  2.1  --   PHOS  --  5.3*  --   --    GFR: Estimated Creatinine Clearance: 7.4 mL/min (A) (by C-G formula based on SCr of 7.13 mg/dL (H)). Liver Function Tests: Recent Labs  Lab 11/15/17 1630 11/15/17 2152  AST  --  40  ALT  --  34  ALKPHOS  --  243*  BILITOT  --  1.7*  PROT  --  6.2*  ALBUMIN 2.8* 2.6*   No results for input(s): LIPASE, AMYLASE in the last 168 hours. No results for input(s): AMMONIA in the last 168 hours. Coagulation Profile: Recent Labs  Lab 11/14/17 1743  INR 1.18   Cardiac Enzymes: No  results for input(s): CKTOTAL, CKMB, CKMBINDEX, TROPONINI in the last 168 hours. BNP (last 3 results) No results for input(s): PROBNP in the last 8760 hours. HbA1C: No results for input(s): HGBA1C in the last 72 hours. CBG: Recent Labs  Lab 11/16/17 0745 11/16/17 1205 11/16/17 1635 11/16/17 2103 11/17/17 1229  GLUCAP 92 160* 111* 156* 98   Lipid Profile: No results for input(s): CHOL, HDL, LDLCALC, TRIG, CHOLHDL, LDLDIRECT in the last 72 hours. Thyroid Function Tests: No results for input(s): TSH, T4TOTAL, FREET4, T3FREE, THYROIDAB in the last 72 hours. Anemia Panel: Recent Labs    11/15/17 0838  TIBC 249*  IRON 29   Urine analysis:    Component Value Date/Time   COLORURINE STRAW (A) 11/16/2017 0301   APPEARANCEUR CLEAR 11/16/2017 0301   LABSPEC 1.004 (L) 11/16/2017 0301   PHURINE 8.0 11/16/2017 0301   GLUCOSEU 50 (A) 11/16/2017 0301   HGBUR SMALL (A) 11/16/2017 0301   BILIRUBINUR NEGATIVE 11/16/2017 0301   KETONESUR 5 (A) 11/16/2017 0301   PROTEINUR 100 (A) 11/16/2017 0301   UROBILINOGEN 0.2 10/18/2011 1003   NITRITE NEGATIVE 11/16/2017 0301   LEUKOCYTESUR MODERATE (A) 11/16/2017 0301   Sepsis Labs: @LABRCNTIP (procalcitonin:4,lacticidven:4)  ) Recent Results (from the past 240 hour(s))  Culture, blood (routine x 2)     Status: None (Preliminary result)   Collection Time: 11/14/17  5:53 PM  Result Value Ref Range Status   Specimen Description BLOOD LEFT HAND  Final   Special Requests   Final    Blood Culture results may not be optimal due to an inadequate volume of blood received in culture bottles BOTTLES DRAWN AEROBIC AND ANAEROBIC   Culture NO GROWTH 3 DAYS  Final   Report Status PENDING  Incomplete  Culture, blood (routine x 2)     Status: None (Preliminary result)   Collection Time: 11/14/17  6:37 PM  Result Value Ref Range Status   Specimen Description BLOOD RIGHT ANTECUBITAL  Final   Special Requests   Final    Blood Culture adequate volume BOTTLES  DRAWN AEROBIC AND ANAEROBIC   Culture NO GROWTH 3 DAYS  Final   Report Status PENDING  Incomplete  Urine culture     Status: Abnormal   Collection Time: 11/16/17  3:02 AM  Result Value Ref Range Status   Specimen Description URINE, RANDOM  Final   Special Requests NONE  Final   Culture MULTIPLE SPECIES PRESENT, SUGGEST RECOLLECTION (A)  Final   Report Status 11/17/2017 FINAL  Final         Radiology Studies: No results found.      Scheduled Meds: . atorvastatin  40 mg Oral QPM  . azithromycin  500 mg Oral Daily  . enoxaparin (LOVENOX) injection  30  mg Subcutaneous QHS  . fluticasone  1 spray Each Nare Daily  . insulin aspart  0-5 Units Subcutaneous QHS  . insulin aspart  0-9 Units Subcutaneous TID WC  . metoprolol tartrate  25 mg Oral BID  . sodium chloride flush  3 mL Intravenous Q12H   Continuous Infusions: . sodium chloride    . cefTRIAXone (ROCEPHIN)  IV Stopped (11/17/17 1316)  . [START ON 11/19/2017] ferric gluconate (FERRLECIT/NULECIT) IV       LOS: 3 days    Time spent: 51min    Domenic Polite, MD [facility] Page via [email or web address], password [email or web address] After 7PM please contact night-coverage  11/17/2017, 2:34 PM

## 2017-11-17 NOTE — Plan of Care (Signed)
Patient progressing 

## 2017-11-18 ENCOUNTER — Inpatient Hospital Stay (HOSPITAL_COMMUNITY): Payer: Self-pay

## 2017-11-18 DIAGNOSIS — N184 Chronic kidney disease, stage 4 (severe): Secondary | ICD-10-CM

## 2017-11-18 DIAGNOSIS — N186 End stage renal disease: Secondary | ICD-10-CM

## 2017-11-18 LAB — BPAM RBC
Blood Product Expiration Date: 201812202359
Blood Product Expiration Date: 201812202359
Blood Product Expiration Date: 201812212359
ISSUE DATE / TIME: 201811280834
ISSUE DATE / TIME: 201811282035
ISSUE DATE / TIME: 201811301038
Unit Type and Rh: 5100
Unit Type and Rh: 5100
Unit Type and Rh: 5100

## 2017-11-18 LAB — TYPE AND SCREEN
ABO/RH(D): O POS
Antibody Screen: NEGATIVE
Unit division: 0
Unit division: 0
Unit division: 0

## 2017-11-18 LAB — GLUCOSE, CAPILLARY
GLUCOSE-CAPILLARY: 46 mg/dL — AB (ref 65–99)
GLUCOSE-CAPILLARY: 75 mg/dL (ref 65–99)
GLUCOSE-CAPILLARY: 221 mg/dL — AB (ref 65–99)
Glucose-Capillary: 228 mg/dL — ABNORMAL HIGH (ref 65–99)
Glucose-Capillary: 177 mg/dL — ABNORMAL HIGH (ref 65–99)

## 2017-11-18 LAB — BASIC METABOLIC PANEL
ANION GAP: 11 (ref 5–15)
BUN: 29 mg/dL — ABNORMAL HIGH (ref 6–20)
CHLORIDE: 105 mmol/L (ref 101–111)
CO2: 24 mmol/L (ref 22–32)
CREATININE: 5.1 mg/dL — AB (ref 0.44–1.00)
Calcium: 7.4 mg/dL — ABNORMAL LOW (ref 8.9–10.3)
GFR calc non Af Amer: 9 mL/min — ABNORMAL LOW (ref >60)
GFR, EST AFRICAN AMERICAN: 10 mL/min — AB (ref >60)
Glucose, Bld: 57 mg/dL — ABNORMAL LOW (ref 65–99)
POTASSIUM: 3.9 mmol/L (ref 3.5–5.1)
SODIUM: 140 mmol/L (ref 135–145)

## 2017-11-18 LAB — CBC
HEMATOCRIT: 33.4 % — AB (ref 36.0–46.0)
HEMOGLOBIN: 10.8 g/dL — AB (ref 12.0–15.0)
MCH: 28.3 pg (ref 26.0–34.0)
MCHC: 32.3 g/dL (ref 30.0–36.0)
MCV: 87.4 fL (ref 78.0–100.0)
Platelets: 284 10*3/uL (ref 150–400)
RBC: 3.82 MIL/uL — AB (ref 3.87–5.11)
RDW: 15.1 % (ref 11.5–15.5)
WBC: 9.5 10*3/uL (ref 4.0–10.5)

## 2017-11-18 MED ORDER — CEFPODOXIME PROXETIL 200 MG PO TABS
200.0000 mg | ORAL_TABLET | Freq: Two times a day (BID) | ORAL | Status: DC
  Filled 2017-11-18: qty 1

## 2017-11-18 MED ORDER — CEFPODOXIME PROXETIL 200 MG PO TABS
200.0000 mg | ORAL_TABLET | ORAL | Status: DC
  Administered 2017-11-18 – 2017-11-19 (×2): 200 mg via ORAL
  Filled 2017-11-18 (×6): qty 1

## 2017-11-18 NOTE — Progress Notes (Signed)
CRITICAL VALUE ALERT  Critical Value:  CBG 46  Date & Time Notied:  **8.20 Am*  Provider Notified: Katrine Coho  Orders Received/Actions taken:1 cup of orange juice given,rechecked CBG after 15 min later it was 75

## 2017-11-18 NOTE — Progress Notes (Signed)
Assessment/Plan: 1. ESRD, new start, planning to go back to [place] for dialysis at discharge 2. PNA - improving. 3. pSVT post HD 4 Anemia with iron defic 5 Atrophic right kidney  Plan HD Monday, IV iron, vein mapping and will need VVS to see for AVF.          Interestingly, patient returning to [place] on 12/8 via airplane.  They have arranged f/u dialysis there and have been in contact with          a nephrologist.  I think it is prudent to get AVF, optimize PNA treatment and discharge after logistics of care completed.  Subjective: Interval History: A little better  Objective: Vital signs in last 24 hours: Temp:  [98.4 F (36.9 C)-98.8 F (37.1 C)] 98.8 F (37.1 C) (12/02 0608) Pulse Rate:  [70-115] 70 (12/02 0608) Resp:  [15-18] 18 (12/02 0608) BP: (145-161)/(71-75) 151/71 (12/02 0608) SpO2:  [95 %-100 %] 96 % (12/02 0757) Weight change:   Intake/Output from previous day: 12/01 0701 - 12/02 0700 In: 170 [P.O.:120; IV Piggyback:50] Out: 2352 [Urine:851; Stool:1] Intake/Output this shift: No intake/output data recorded.  General appearance: alert and cooperative Back: negative, symmetric, no curvature. ROM normal. No CVA tenderness. Resp: clear to auscultation bilaterally Chest wall: no tenderness Cardio: regular rate and rhythm, S1, S2 normal, no murmur, click, rub or gallop Extremities: extremities normal, atraumatic, no cyanosis or edema  Lab Results: Recent Labs    11/17/17 0243 11/18/17 0405  WBC 8.2 9.5  HGB 9.8* 10.8*  HCT 29.4* 33.4*  PLT 255 284   BMET:  Recent Labs    11/17/17 0243 11/18/17 0405  NA 140 140  K 3.3* 3.9  CL 107 105  CO2 21* 24  GLUCOSE 120* 57*  BUN 51* 29*  CREATININE 7.13* 5.10*  CALCIUM 6.5* 7.4*   No results for input(s): PTH in the last 72 hours. Iron Studies: No results for input(s): IRON, TIBC, TRANSFERRIN, FERRITIN in the last 72 hours. Studies/Results: Dg Chest Port 1 View  Result Date: 11/17/2017 CLINICAL DATA:   Pneumonia. Congestive heart failure. Chronic kidney disease stage 5. EXAM: PORTABLE CHEST 1 VIEW COMPARISON:  11/14/2017 FINDINGS: Heart size remains stable. New left jugular dual-lumen central venous catheter is seen, with tip overlying the mid right atrium. No pneumothorax visualized. The previously seen areas of airspace disease in both upper lobes and right lung base have decreased since previous study. No evidence of pneumothorax or pleural effusion. IMPRESSION: Decreased bilateral upper lobe and right basilar airspace opacity, consistent with resolving pneumonia. Electronically Signed   By: Earle Gell M.D.   On: 11/17/2017 14:55    Scheduled: . atorvastatin  40 mg Oral QPM  . cefpodoxime  200 mg Oral Q24H  . enoxaparin (LOVENOX) injection  30 mg Subcutaneous QHS  . fluticasone  1 spray Each Nare Daily  . insulin aspart  0-9 Units Subcutaneous TID WC  . metoprolol tartrate  25 mg Oral BID  . sodium chloride flush  3 mL Intravenous Q12H     LOS: 4 days   Estanislado Emms 11/18/2017,11:06 AM

## 2017-11-18 NOTE — Progress Notes (Signed)
Bilateral upper extremity vein mapping has been completed.  11/18/17 1:49 PM [name] RVT

## 2017-11-18 NOTE — Progress Notes (Signed)
PROGRESS NOTE    [name]  GYI:[id number] DOB: [birth year] DOA: 11/14/2017 PCP: Mack Hook, MD  Brief Narrative:  [name] is an 55 y.o. female with DM. Blindness,  CKDV nearing dialysis who had been refusing dialysis followed by [facility] Dr. Justin Mend. She presented with dyspnea, cough but denies myalgias, rigors, fevers. CT chest with multiple pulmonary infiltrrates present and cardiomegaly without pulmonary edema on CXR. creatinine is 10, bicarbonate is 13 Renal consulting, started HD this admission  Assessment & Plan:    Multifocal pneumonia -treated with ceftriaxone and azithromycin -cT chest notes consolidation not pulmonary edema -will transition to oral antibiotics today -FU blood and sputum cultures-no growth -follow-up chest x-ray in 4-6 weeks    Progressive CKD5/metabolic acidosis -Now ESRD -presenting with a creatinine of 10 and bicarbonate of 13 -Finally agreeable to start hemodialysis, renal consulting, IR consulted, status post temporary HD catheter 11/29 -Status post first hemodialysis treatment 11/29, then again 12/1 -Diet education -Clip for outpatient HD started -Per Renal -plan for AV fistula per renal    Diabetes mellitus  -glipizide on hold -Continue sliding scale insulin -HbA1c in August was 7.3, Will repeat    Essential hypertension -norvasc on hold, BP stable   Anemia of chronic disease -And iron deficiency noted on anemia panel -No overt blood loss noted -Status post  2units PRBC this admission -Needs iron and EPO with dialysis per renal  DVT prophylaxis: lovenox Code Status: Full Code Family Communication:  None at bedside, seen on HD  Disposition Plan: Home when stable, CLIPPED for HD, infact she is planning to go to [place] on 12/8  Consultants:   Renal   Procedures:   Antimicrobials:  Rocephin/zithromax 11/18  Subjective: -feels ok, tolerated HD yesterday, no N/V, breathing  improving  Objective: Vitals:   11/17/17 2130 11/17/17 2327 11/18/17 0608 11/18/17 0757  BP: (!) 145/71  (!) 151/71   Pulse: (!) 115 83 70   Resp: 15  18   Temp: 98.4 F (36.9 C)  98.8 F (37.1 C)   TempSrc: Oral  Oral   SpO2: 98%  95% 96%  Weight:      Height:        Intake/Output Summary (Last 24 hours) at 11/18/2017 1255 Last data filed at 11/18/2017 0655 Gross per 24 hour  Intake 170 ml  Output 352 ml  Net -182 ml   Filed Weights   11/15/17 1545 11/15/17 1845 11/17/17 0734  Weight: 60.6 kg (133 lb 9.6 oz) 59.9 kg (132 lb 0.9 oz) 59 kg (130 lb 1.1 oz)    Examination: Seen on HD, no distress Gen: Awake, Alert, Oriented X 3, pleasant, blind female HEENT: PERRLA, Neck supple, no JVD Lungs: diminished BS at bases, rest clear CVS: S!S2/RRR Abd: soft, Non tender, non distended, BS present Extremities: no edema Skin: no new rashes    Data Reviewed:   CBC: Recent Labs  Lab 11/15/17 1414 11/15/17 1630 11/16/17 0406 11/17/17 0243 11/18/17 0405  WBC 8.8 9.0 8.4 8.2 9.5  HGB 8.1* 7.4* 7.3* 9.8* 10.8*  HCT 24.7* 22.5* 22.4* 29.4* 33.4*  MCV 86.1 85.6 85.5 84.7 87.4  PLT 273 278 255 255 350   Basic Metabolic Panel: Recent Labs  Lab 11/14/17 1411 11/15/17 1630 11/15/17 2152 11/17/17 0243 11/18/17 0405  NA 140 140 136 140 140  K 4.5 4.0 4.0 3.3* 3.9  CL 114* 114* 104 107 105  CO2 13* 14* 20* 21* 24  GLUCOSE 58* 94 125* 120* 57*  BUN 80* 85* 37* 51* 29*  CREATININE 10.53* 10.56* 5.79* 7.13* 5.10*  CALCIUM 6.8* 6.3* 7.2* 6.5* 7.4*  MG  --   --  2.1  --   --   PHOS  --  5.3*  --   --   --    GFR: Estimated Creatinine Clearance: 10.3 mL/min (A) (by C-G formula based on SCr of 5.1 mg/dL (H)). Liver Function Tests: Recent Labs  Lab 11/15/17 1630 11/15/17 2152  AST  --  40  ALT  --  34  ALKPHOS  --  243*  BILITOT  --  1.7*  PROT  --  6.2*  ALBUMIN 2.8* 2.6*   No results for input(s): LIPASE, AMYLASE in the last 168 hours. No results for input(s):  AMMONIA in the last 168 hours. Coagulation Profile: Recent Labs  Lab 11/14/17 1743  INR 1.18   Cardiac Enzymes: No results for input(s): CKTOTAL, CKMB, CKMBINDEX, TROPONINI in the last 168 hours. BNP (last 3 results) No results for input(s): PROBNP in the last 8760 hours. HbA1C: No results for input(s): HGBA1C in the last 72 hours. CBG: Recent Labs  Lab 11/17/17 1704 11/17/17 2154 11/18/17 0816 11/18/17 0846 11/18/17 1246  GLUCAP 81 78 46* 75 221*   Lipid Profile: No results for input(s): CHOL, HDL, LDLCALC, TRIG, CHOLHDL, LDLDIRECT in the last 72 hours. Thyroid Function Tests: No results for input(s): TSH, T4TOTAL, FREET4, T3FREE, THYROIDAB in the last 72 hours. Anemia Panel: No results for input(s): VITAMINB12, FOLATE, FERRITIN, TIBC, IRON, RETICCTPCT in the last 72 hours. Urine analysis:    Component Value Date/Time   COLORURINE STRAW (A) 11/16/2017 0301   APPEARANCEUR CLEAR 11/16/2017 0301   LABSPEC 1.004 (L) 11/16/2017 0301   PHURINE 8.0 11/16/2017 0301   GLUCOSEU 50 (A) 11/16/2017 0301   HGBUR SMALL (A) 11/16/2017 0301   BILIRUBINUR NEGATIVE 11/16/2017 0301   KETONESUR 5 (A) 11/16/2017 0301   PROTEINUR 100 (A) 11/16/2017 0301   UROBILINOGEN 0.2 10/18/2011 1003   NITRITE NEGATIVE 11/16/2017 0301   LEUKOCYTESUR MODERATE (A) 11/16/2017 0301   Sepsis Labs: @LABRCNTIP (procalcitonin:4,lacticidven:4)  ) Recent Results (from the past 240 hour(s))  Culture, blood (routine x 2)     Status: None (Preliminary result)   Collection Time: 11/14/17  5:53 PM  Result Value Ref Range Status   Specimen Description BLOOD LEFT HAND  Final   Special Requests   Final    Blood Culture results may not be optimal due to an inadequate volume of blood received in culture bottles BOTTLES DRAWN AEROBIC AND ANAEROBIC   Culture NO GROWTH 3 DAYS  Final   Report Status PENDING  Incomplete  Culture, blood (routine x 2)     Status: None (Preliminary result)   Collection Time: 11/14/17   6:37 PM  Result Value Ref Range Status   Specimen Description BLOOD RIGHT ANTECUBITAL  Final   Special Requests   Final    Blood Culture adequate volume BOTTLES DRAWN AEROBIC AND ANAEROBIC   Culture NO GROWTH 3 DAYS  Final   Report Status PENDING  Incomplete  Urine culture     Status: Abnormal   Collection Time: 11/16/17  3:02 AM  Result Value Ref Range Status   Specimen Description URINE, RANDOM  Final   Special Requests NONE  Final   Culture MULTIPLE SPECIES PRESENT, SUGGEST RECOLLECTION (A)  Final   Report Status 11/17/2017 FINAL  Final         Radiology Studies: Dg Chest Port 1 View  Result  Date: 11/17/2017 CLINICAL DATA:  Pneumonia. Congestive heart failure. Chronic kidney disease stage 5. EXAM: PORTABLE CHEST 1 VIEW COMPARISON:  11/14/2017 FINDINGS: Heart size remains stable. New left jugular dual-lumen central venous catheter is seen, with tip overlying the mid right atrium. No pneumothorax visualized. The previously seen areas of airspace disease in both upper lobes and right lung base have decreased since previous study. No evidence of pneumothorax or pleural effusion. IMPRESSION: Decreased bilateral upper lobe and right basilar airspace opacity, consistent with resolving pneumonia. Electronically Signed   By: Earle Gell M.D.   On: 11/17/2017 14:55        Scheduled Meds: . atorvastatin  40 mg Oral QPM  . cefpodoxime  200 mg Oral Q24H  . enoxaparin (LOVENOX) injection  30 mg Subcutaneous QHS  . fluticasone  1 spray Each Nare Daily  . insulin aspart  0-9 Units Subcutaneous TID WC  . metoprolol tartrate  25 mg Oral BID  . sodium chloride flush  3 mL Intravenous Q12H   Continuous Infusions: . sodium chloride    . [START ON 11/19/2017] ferric gluconate (FERRLECIT/NULECIT) IV       LOS: 4 days    Time spent: 15min    Domenic Polite, MD [facility] Page via [email or web address], password [email or web address] After 7PM please contact night-coverage  11/18/2017, 12:55 PM

## 2017-11-18 NOTE — Consult Note (Signed)
Hospital Consult    Reason for Consult:  Hd access Referring Physician:  Dr. Florene Glen [facility]) MRN #:  [id number]  History of Present Illness: This is a 56 y.o. female currently on hd via right ij tunneled catheter. She is right hand dominant. She has never had permanent access. Her plan is to get dialysis upon return to [place]. She does not take any blood thinners. We are consulted to place access.   Patient does not speak English and all information was gathered via use of interpreter  Past Medical History:  Diagnosis Date  . Ascites 10/17/2011   See ovarian mass  . Cataract 02/24/2013   Right remains--No benefit for surgery due to retinal traction ischemia, atrophy, holes as per Dr. Ria Comment, [facility]  . Chronic pain syndrome 2013   neck, back, side  . Constipation   . Controlled type 2 diabetes mellitus with diabetic retinopathy ([place])   . Diabetic nephropathy ([place])    Creatinine 1.38 09/2012  . Hypertension   . Ovarian mass 10/17/2011   27.5 cm endometrioma replacing left ovary with 12 L brown ascites.  Pt. underwent TAH, BSO with removal of the endometrioma as well and removal of the ascites.  No findings of malignancy  . Proliferative diabetic retinopathy ([place]) 09/2012   Followed by Dr. Gearlean Alf, Retinal Specialist at [facility] and Dr. Jolyn Nap, [facility] [place] specialist    Past Surgical History:  Procedure Laterality Date  . ABDOMINAL HYSTERECTOMY  10/17/2011   BSO for large left ovarian endometrioma  . EXPLORATORY LAPAROTOMY  2012   12 L of ascites drainage  . EYE SURGERY Left 2015   Laser treatment to left eye  . EYE SURGERY Left 10/2014   Cataract extraction, left eye  . IR FLUORO GUIDE CV LINE RIGHT  11/15/2017  . IR US GUIDE VASC ACCESS RIGHT  11/15/2017  . TUBAL LIGATION  1986    Allergies  Allergen Reactions  . Trazodone Anaphylaxis    Daughter doubts this, but [facility] had this entered in from 2015    Prior to Admission  medications   Medication Sig Start Date End Date Taking? Authorizing Provider  acetaminophen (TYLENOL) 500 MG tablet Take 500-1,000 mg by mouth every 6 (six) hours as needed for mild pain or headache.   Yes [provider]  amLODipine (NORVASC) 5 MG tablet 1 1/2 tabs by mouth daily Patient taking differently: Take 7.5 mg by mouth daily.  09/06/17  Yes Mack Hook, MD  atorvastatin (LIPITOR) 40 MG tablet 1 tab by mouth with evening meal Patient taking differently: Take 40 mg by mouth every evening. 1 tab by mouth with evening meal 07/28/17  Yes Mack Hook, MD  glipiZIDE (GLUCOTROL XL) 5 MG 24 hr tablet TAKE ONE TABLET BY MOUTH ONCE DAILY WITH BREAKFAST Patient taking differently: Take 5 mg by mouth once a day 07/02/17  Yes Mack Hook, MD  metaxalone (SKELAXIN) 400 MG tablet TAKE ONE TABLET BY MOUTH AT BEDTIME AS NEEDED FOR NECK AND BACK PAIN Patient not taking: Reported on 11/14/2017 10/24/16   Mack Hook, MD    Social History   Socioeconomic History  . Marital status: Married    Spouse name: Not on file  . Number of children: 4  . Years of education: Not on file  . Highest education level: Not on file  Social Needs  . Financial resource strain: Not on file  . Food insecurity - worry: Not on file  . Food insecurity -  inability: Not on file  . Transportation needs - medical: Not on file  . Transportation needs - non-medical: Not on file  Occupational History  . Occupation: Housewife  Tobacco Use  . Smoking status: Never Smoker  . Smokeless tobacco: Never Used  Substance and Sexual Activity  . Alcohol use: No    Alcohol/week: 0.0 oz  . Drug use: No  . Sexual activity: Not Currently  Other Topics Concern  . Not on file  Social History Narrative   Lives at home with husband and adult son with developmental disabilities   She is main caretaker for son   Isolation with loss of vision     Family History  Problem Relation Age of Onset  .  Heart attack Mother   . Cerebral palsy Son        With developmental delay    REVIEW OF SYSTEMS (negative unless checked):   Cardiac:  []  Chest pain or chest pressure? []  Shortness of breath upon activity? []  Shortness of breath when lying flat? []  Irregular heart rhythm?  Vascular:  []  Pain in calf, thigh, or hip brought on by walking? []  Pain in feet at night that wakes you up from your sleep? []  Blood clot in your veins? []  Leg swelling?  Pulmonary:  []  Oxygen at home? []  Productive cough? []  Wheezing?  Neurologic:  []  Sudden weakness in arms or legs? []  Sudden numbness in arms or legs? []  Sudden onset of difficult speaking or slurred speech? []  Temporary loss of vision in one eye? []  Problems with dizziness?  Gastrointestinal:  []  Blood in stool? []  Vomited blood?  Genitourinary:  []  Burning when urinating? []  Blood in urine?  Psychiatric:  []  Major depression  Hematologic:  []  Bleeding problems? []  Problems with blood clotting?  Dermatologic:  []  Rashes or ulcers?  Constitutional:  []  Fever or chills?  Ear/Nose/Throat:  []  Change in hearing? []  Nose bleeds? []  Sore throat?  Musculoskeletal:  []  Back pain? []  Joint pain? []  Muscle pain?   Physical Examination  Vitals:   11/18/17 0608 11/18/17 0757  BP: (!) 151/71   Pulse: 70   Resp: 18   Temp: 98.8 F (37.1 C)   SpO2: 95% 96%   Body mass index is 24.58 kg/m.  General:  WDWN in NAD HENT: WNL, normocephalic Pulmonary: normal non-labored breathing, without Rales, rhonchi,  wheezing Cardiac: palpable radial pulses bilaterally Abdomen:  soft, NT/ND, no masses Extremities: small hematoma left antecubitum Musculoskeletal: no muscle wasting or atrophy  Neurologic: A&O X 3 ; SENSATION: normal; MOTOR FUNCTION:  moving all extremities equally. Spanish speech is normal Psychiatric:  Appropriate Affect  CBC    Component Value Date/Time   WBC 9.5 11/18/2017 0405   RBC 3.82 (L)  11/18/2017 0405   HGB 10.8 (L) 11/18/2017 0405   HGB 10.8 (L) 08/11/2016 1105   HCT 33.4 (L) 11/18/2017 0405   HCT 33.2 (L) 08/11/2016 1105   PLT 284 11/18/2017 0405   PLT 274 08/11/2016 1105   MCV 87.4 11/18/2017 0405   MCV 89 08/11/2016 1105   MCH 28.3 11/18/2017 0405   MCHC 32.3 11/18/2017 0405   RDW 15.1 11/18/2017 0405   RDW 14.5 08/11/2016 1105   LYMPHSABS 2.0 08/11/2016 1105   MONOABS 0.7 10/13/2011 1330   EOSABS 0.2 08/11/2016 1105   BASOSABS 0.1 08/11/2016 1105    BMET    Component Value Date/Time   NA 140 11/18/2017 0405   NA 145 (H) 11/05/2017 0908   K  3.9 11/18/2017 0405   CL 105 11/18/2017 0405   CO2 24 11/18/2017 0405   GLUCOSE 57 (L) 11/18/2017 0405   BUN 29 (H) 11/18/2017 0405   BUN 84 (HH) 11/05/2017 0908   CREATININE 5.10 (H) 11/18/2017 0405   CALCIUM 7.4 (L) 11/18/2017 0405   GFRNONAA 9 (L) 11/18/2017 0405   GFRAA 10 (L) 11/18/2017 0405    COAGS: Lab Results  Component Value Date   INR 1.18 11/14/2017   INR 1.26 10/09/2011     Non-Invasive Vascular Imaging:   Vein mapping pending   ASSESSMENT/PLAN: This is a 56 y.o. female in need of permanent dialysis access currently dialyzing via a tunneled catheter.  She will return to [place] to get her permanent dialysis.  Vein mapping is pending and we will plan left arm fistula versus graft following.  No IVs or blood draws in the left arm.  Sircharles Holzheimer C. Donzetta Matters, MD [facility] [place] Office: [phone] Pager: [phone]

## 2017-11-18 NOTE — Plan of Care (Signed)
Patient progressing 

## 2017-11-19 LAB — GLUCOSE, CAPILLARY
GLUCOSE-CAPILLARY: 137 mg/dL — AB (ref 65–99)
GLUCOSE-CAPILLARY: 236 mg/dL — AB (ref 65–99)
GLUCOSE-CAPILLARY: 99 mg/dL (ref 65–99)

## 2017-11-19 LAB — RENAL FUNCTION PANEL
ALBUMIN: 2.9 g/dL — AB (ref 3.5–5.0)
ANION GAP: 12 (ref 5–15)
BUN: 51 mg/dL — ABNORMAL HIGH (ref 6–20)
CO2: 22 mmol/L (ref 22–32)
Calcium: 6.8 mg/dL — ABNORMAL LOW (ref 8.9–10.3)
Chloride: 105 mmol/L (ref 101–111)
Creatinine, Ser: 6.8 mg/dL — ABNORMAL HIGH (ref 0.44–1.00)
GFR calc Af Amer: 7 mL/min — ABNORMAL LOW (ref >60)
GFR, EST NON AFRICAN AMERICAN: 6 mL/min — AB (ref >60)
Glucose, Bld: 110 mg/dL — ABNORMAL HIGH (ref 65–99)
PHOSPHORUS: 6.1 mg/dL — AB (ref 2.5–4.6)
POTASSIUM: 3.7 mmol/L (ref 3.5–5.1)
Sodium: 139 mmol/L (ref 135–145)

## 2017-11-19 LAB — CULTURE, BLOOD (ROUTINE X 2)
CULTURE: NO GROWTH
CULTURE: NO GROWTH
SPECIAL REQUESTS: ADEQUATE

## 2017-11-19 LAB — CBC
HEMATOCRIT: 30 % — AB (ref 36.0–46.0)
HEMOGLOBIN: 9.8 g/dL — AB (ref 12.0–15.0)
MCH: 28.1 pg (ref 26.0–34.0)
MCHC: 32.7 g/dL (ref 30.0–36.0)
MCV: 86 fL (ref 78.0–100.0)
Platelets: 274 10*3/uL (ref 150–400)
RBC: 3.49 MIL/uL — ABNORMAL LOW (ref 3.87–5.11)
RDW: 14.8 % (ref 11.5–15.5)
WBC: 9.1 10*3/uL (ref 4.0–10.5)

## 2017-11-19 LAB — SURGICAL PCR SCREEN
MRSA, PCR: NEGATIVE
Staphylococcus aureus: NEGATIVE

## 2017-11-19 MED ORDER — CEFAZOLIN SODIUM-DEXTROSE 2-4 GM/100ML-% IV SOLN
2.0000 g | Freq: Once | INTRAVENOUS | Status: DC
  Filled 2017-11-19: qty 100

## 2017-11-19 MED ORDER — ALTEPLASE 2 MG IJ SOLR
2.0000 mg | Freq: Once | INTRAMUSCULAR | Status: DC | PRN
  Filled 2017-11-19: qty 2

## 2017-11-19 MED ORDER — SODIUM CHLORIDE 0.9 % IV SOLN: 100.0000 mL | INTRAVENOUS | Status: DC | PRN

## 2017-11-19 MED ORDER — HEPARIN SODIUM (PORCINE) 1000 UNIT/ML DIALYSIS
20.0000 [IU]/kg | INTRAMUSCULAR | Status: DC | PRN
  Filled 2017-11-19: qty 2

## 2017-11-19 MED ORDER — CHLORHEXIDINE GLUCONATE CLOTH 2 % EX PADS
6.0000 | MEDICATED_PAD | Freq: Once | CUTANEOUS | Status: AC
  Administered 2017-11-19: 6 via TOPICAL

## 2017-11-19 MED ORDER — CEFAZOLIN SODIUM-DEXTROSE 2-4 GM/100ML-% IV SOLN
2.0000 g | INTRAVENOUS | Status: AC
  Administered 2017-11-20: 1 g via INTRAVENOUS
  Filled 2017-11-19 (×2): qty 100

## 2017-11-19 MED ORDER — LIDOCAINE HCL (PF) 1 % IJ SOLN
5.0000 mL | INTRAMUSCULAR | Status: DC | PRN
  Filled 2017-11-19: qty 5

## 2017-11-19 MED ORDER — CHLORHEXIDINE GLUCONATE CLOTH 2 % EX PADS
6.0000 | MEDICATED_PAD | Freq: Once | CUTANEOUS | Status: AC
  Administered 2017-11-20: 6 via TOPICAL

## 2017-11-19 MED ORDER — HEPARIN SODIUM (PORCINE) 1000 UNIT/ML DIALYSIS
1000.0000 [IU] | INTRAMUSCULAR | Status: DC | PRN
  Filled 2017-11-19: qty 1

## 2017-11-19 MED ORDER — PENTAFLUOROPROP-TETRAFLUOROETH EX AERO: 1.0000 "application " | INHALATION_SPRAY | CUTANEOUS | Status: DC | PRN

## 2017-11-19 MED ORDER — LIDOCAINE-PRILOCAINE 2.5-2.5 % EX CREA
1.0000 "application " | TOPICAL_CREAM | CUTANEOUS | Status: DC | PRN
  Filled 2017-11-19: qty 5

## 2017-11-19 NOTE — Progress Notes (Signed)
  Progress Note    11/19/2017 11:23 AM * No surgery date entered *  Subjective:  No acute issues  Vitals:   11/19/17 0559 11/19/17 0634  BP: (!) 167/68 140/70  Pulse: 62   Resp:    Temp:    SpO2:      Physical Exam: Awake and alert Palpable left radial pulse  CBC    Component Value Date/Time   WBC 9.5 11/18/2017 0405   RBC 3.82 (L) 11/18/2017 0405   HGB 10.8 (L) 11/18/2017 0405   HGB 10.8 (L) 08/11/2016 1105   HCT 33.4 (L) 11/18/2017 0405   HCT 33.2 (L) 08/11/2016 1105   PLT 284 11/18/2017 0405   PLT 274 08/11/2016 1105   MCV 87.4 11/18/2017 0405   MCV 89 08/11/2016 1105   MCH 28.3 11/18/2017 0405   MCHC 32.3 11/18/2017 0405   RDW 15.1 11/18/2017 0405   RDW 14.5 08/11/2016 1105   LYMPHSABS 2.0 08/11/2016 1105   MONOABS 0.7 10/13/2011 1330   EOSABS 0.2 08/11/2016 1105   BASOSABS 0.1 08/11/2016 1105    BMET    Component Value Date/Time   NA 140 11/18/2017 0405   NA 145 (H) 11/05/2017 0908   K 3.9 11/18/2017 0405   CL 105 11/18/2017 0405   CO2 24 11/18/2017 0405   GLUCOSE 57 (L) 11/18/2017 0405   BUN 29 (H) 11/18/2017 0405   BUN 84 (HH) 11/05/2017 0908   CREATININE 5.10 (H) 11/18/2017 0405   CALCIUM 7.4 (L) 11/18/2017 0405   GFRNONAA 9 (L) 11/18/2017 0405   GFRAA 10 (L) 11/18/2017 0405    INR    Component Value Date/Time   INR 1.18 11/14/2017 1743     Intake/Output Summary (Last 24 hours) at 11/19/2017 1123 Last data filed at 11/19/2017 0551 Gross per 24 hour  Intake 180 ml  Output -  Net 180 ml     Assessment:  56 y.o. female is here with esrd in need of permanent access. Has tunneled catheter  Plan: Plan for OR tomorrow single stage basilic vein avf vs avg.    Maynor Mwangi C. Donzetta Matters, MD [facility] [place] Office: [phone] Pager: [phone]  11/19/2017 11:23 AM

## 2017-11-19 NOTE — H&P (View-Only) (Signed)
  Progress Note    11/19/2017 11:23 AM * No surgery date entered *  Subjective:  No acute issues  Vitals:   11/19/17 0559 11/19/17 0634  BP: (!) 167/68 140/70  Pulse: 62   Resp:    Temp:    SpO2:      Physical Exam: Awake and alert Palpable left radial pulse  CBC    Component Value Date/Time   WBC 9.5 11/18/2017 0405   RBC 3.82 (L) 11/18/2017 0405   HGB 10.8 (L) 11/18/2017 0405   HGB 10.8 (L) 08/11/2016 1105   HCT 33.4 (L) 11/18/2017 0405   HCT 33.2 (L) 08/11/2016 1105   PLT 284 11/18/2017 0405   PLT 274 08/11/2016 1105   MCV 87.4 11/18/2017 0405   MCV 89 08/11/2016 1105   MCH 28.3 11/18/2017 0405   MCHC 32.3 11/18/2017 0405   RDW 15.1 11/18/2017 0405   RDW 14.5 08/11/2016 1105   LYMPHSABS 2.0 08/11/2016 1105   MONOABS 0.7 10/13/2011 1330   EOSABS 0.2 08/11/2016 1105   BASOSABS 0.1 08/11/2016 1105    BMET    Component Value Date/Time   NA 140 11/18/2017 0405   NA 145 (H) 11/05/2017 0908   K 3.9 11/18/2017 0405   CL 105 11/18/2017 0405   CO2 24 11/18/2017 0405   GLUCOSE 57 (L) 11/18/2017 0405   BUN 29 (H) 11/18/2017 0405   BUN 84 (HH) 11/05/2017 0908   CREATININE 5.10 (H) 11/18/2017 0405   CALCIUM 7.4 (L) 11/18/2017 0405   GFRNONAA 9 (L) 11/18/2017 0405   GFRAA 10 (L) 11/18/2017 0405    INR    Component Value Date/Time   INR 1.18 11/14/2017 1743     Intake/Output Summary (Last 24 hours) at 11/19/2017 1123 Last data filed at 11/19/2017 0551 Gross per 24 hour  Intake 180 ml  Output -  Net 180 ml     Assessment:  56 y.o. female is here with esrd in need of permanent access. Has tunneled catheter  Plan: Plan for OR tomorrow single stage basilic vein avf vs avg.    Jaydon Avina C. Donzetta Matters, MD [facility] [place] Office: [phone] Pager: [phone]  11/19/2017 11:23 AM

## 2017-11-19 NOTE — Evaluation (Signed)
Physical Therapy Evaluation Patient Details Name: [name] MRN: [id number] DOB: March 06, 1961 Today's Date: 11/19/2017   History of Present Illness  [name] is a 56 y.o. female with long hx of DM 2 > 20 yrs, HTN and CKD f/b [facility].  She presents to ED w/ complaint of pain in her back and chest, also SOB.  CXR showed multifocal pulmonary infiltrates and cardiomegaly without pulmonary edema.  CT chest showed multifocal dense infiltrates R > L w/ R sided pleural effusion. Creat here is 10.5. Received HD 11/19/17  Clinical Impression  Pt admitted with above diagnosis. Pt currently with functional limitations due to the deficits listed below (see PT Problem List). Pt is limited in her mobility in unfamiliar surroundings by her decreased vision as well as L LE weakness. Pt family is very supportive and aid in assistance with mobility. Pt currently minA for bed mobility, transfers and ambulation of 50 feet with RW.  Pt will benefit from skilled PT acutely to increase their independence using a cane for safe mobility to discharge home before travelling to [place].  Pt advised to seek PT in [place] for back, shoulder and LE pain and weakness.      Follow Up Recommendations No PT follow up    Equipment Recommendations  None recommended by PT       Precautions / Restrictions Precautions Precautions: Other (comment)(legally blind) Restrictions Weight Bearing Restrictions: No      Mobility  Bed Mobility Overal bed mobility: Needs Assistance Bed Mobility: Supine to Sit     Supine to sit: HOB elevated;Min assist     General bed mobility comments: HHA for moving to EoB due to limited vision  Transfers Overall transfer level: Needs assistance Equipment used: Rolling walker (2 wheeled) Transfers: Sit to/from Stand Sit to Stand: Min assist         General transfer comment: minA for guiding hands to RW, good power up and steadying in standing     Ambulation/Gait Ambulation/Gait assistance: Min assist Ambulation Distance (Feet): 50 Feet Assistive device: Rolling walker (2 wheeled) Gait Pattern/deviations: Step-through pattern;Decreased step length - right;Decreased step length - left;Decreased weight shift to left;Narrow base of support;Drifts right/left Gait velocity: slowed Gait velocity interpretation: Below normal speed for age/gender General Gait Details: minA for guiding RW in hallway, drifts to the right with gait due to decreased weight shift to the left     Balance Overall balance assessment: Needs assistance Sitting-balance support: Feet unsupported;No upper extremity supported Sitting balance-Leahy Scale: Good     Standing balance support: Bilateral upper extremity supported Standing balance-Leahy Scale: Good                               Pertinent Vitals/Pain Pain Assessment: Faces Faces Pain Scale: Hurts a little bit Pain Location: back, shoulders with movement L>R, L LE numbness Pain Descriptors / Indicators: Numbness Pain Intervention(s): Monitored during session    Home Living Family/patient expects to be discharged to:: Private residence Living Arrangements: Spouse/significant other Available Help at Discharge: Family;Available PRN/intermittently Type of Home: House Home Access: Stairs to enter   [employer] of Steps: 2 Home Layout: One level Home Equipment: Cane - single point      Prior Function Level of Independence: Independent with assistive device(s);Needs assistance   Gait / Transfers Assistance Needed: independent in her home with [facility] [facility] requires assist due to limited vision in unfamiliar places  ADL's / Homemaking  Assistance Needed: independant        Hand Dominance        Extremity/Trunk Assessment   Upper Extremity Assessment Upper Extremity Assessment: Defer to OT evaluation    Lower Extremity Assessment Lower Extremity Assessment: LLE  deficits/detail LLE Deficits / Details: hip strength grossly 4/5, knee extesion grossly 4/5, knee ext 5/5, L LE feels numb LLE Sensation: (numb )       Communication   Communication: Prefers language other than English(Spanish, interpreter present throughout session )  Cognition Arousal/Alertness: Awake/alert Behavior During Therapy: WFL for tasks assessed/performed Overall Cognitive Status: Within Functional Limits for tasks assessed                                        General Comments General comments (skin integrity, edema, etc.): On 2L O2 via nasal cannula at entry, SaO2 97% O2, removed O2 on RA sitting EoB SaO2 91% O2, with ambuatation SaO2 increased to 94%O2, left supplemental O2 off while seated in recliner after session     Assessment/Plan    PT Assessment Patient needs continued PT services  PT Problem List Decreased balance;Decreased mobility;Decreased knowledge of use of DME;Decreased safety awareness       PT Treatment Interventions Gait training;DME instruction;Functional mobility training;Therapeutic activities;Therapeutic exercise;Balance training;Neuromuscular re-education;Patient/family education    PT Goals (Current goals can be found in the Care Plan section)  Acute Rehab PT Goals Patient Stated Goal: go to [place] PT Goal Formulation: With patient Time For Goal Achievement: 12/03/17 Potential to Achieve Goals: Good    Frequency Min 3X/week   Barriers to discharge        Co-evaluation PT/OT/SLP Co-Evaluation/Treatment: Yes Reason for Co-Treatment: Other (comment)(use of interpreter) PT goals addressed during session: Mobility/safety with mobility;Proper use of DME         AM-PAC PT "6 Clicks" Daily Activity  Outcome Measure Difficulty turning over in bed (including adjusting bedclothes, sheets and blankets)?: A Little Difficulty moving from lying on back to sitting on the side of the bed? : Unable Difficulty sitting down on and  standing up from a chair with arms (e.g., wheelchair, bedside commode, etc,.)?: Unable Help needed moving to and from a bed to chair (including a wheelchair)?: A Little Help needed walking in hospital room?: A Little Help needed climbing 3-5 steps with a railing? : A Little 6 Click Score: 14    End of Session Equipment Utilized During Treatment: Gait belt Activity Tolerance: Patient tolerated treatment well Patient left: in chair;with call bell/phone within reach;with family/visitor present Nurse Communication: Mobility status;Other (comment)(supplemental O2 removal) PT Visit Diagnosis: Unsteadiness on feet (R26.81);Other abnormalities of gait and mobility (R26.89);Muscle weakness (generalized) (M62.81);Difficulty in walking, not elsewhere classified (R26.2)    Time: 1194-1740 PT Time Calculation (min) (ACUTE ONLY): 40 min   Charges:   PT Evaluation $PT Eval Moderate Complexity: 1 Mod PT Treatments $Gait Training: 8-22 mins   PT G Codes:        Amberlie Gaillard B. Migdalia Dk PT, DPT Acute Rehabilitation  [phone] Pager [phone]    [place] 11/19/2017, 11:22 AM

## 2017-11-19 NOTE — Progress Notes (Signed)
Assessment/Plan: 1. ESRD, new start, planning to go back to [place] for dialysis at discharge 2. PNA - improving. 3. pSVT post HD 4 Anemia with iron defic- hgb over 10 - IV iron  5 Atrophic right kidney 6. HTN/vol- BP a little high- on metoprolol- no change for now- may come down further with more HD  Plan HD today- 3rd tx ,  VVS to see for AVF- they plan to take to OR tomorrow but fear she may need an AVG.          Patient returning to [place] on 12/9 via airplane.  They have arranged f/u dialysis there and have been in contact with a nephrologist.  I think it is prudent to get AVF, optimize PNA treatment and she probably will need to stay to get HD here on Wednesday and Friday before discharge   Subjective: Interval History: A little better  Objective: Vital signs in last 24 hours: Temp:  [98.2 F (36.8 C)-98.6 F (37 C)] 98.2 F (36.8 C) (12/03 0558) Pulse Rate:  [62-69] 62 (12/03 0559) Resp:  [12-20] 12 (12/03 0558) BP: (140-168)/(64-70) 140/70 (12/03 0634) SpO2:  [99 %-100 %] 100 % (12/03 0558) Weight change:   Intake/Output from previous day: 12/02 0701 - 12/03 0700 In: 420 [P.O.:420] Out: -  Intake/Output this shift: No intake/output data recorded.  General appearance: alert and cooperative Back: negative, symmetric, no curvature. ROM normal. No CVA tenderness. Resp: clear to auscultation bilaterally Chest wall: no tenderness Cardio: regular rate and rhythm, S1, S2 normal, no murmur, click, rub or gallop Extremities: extremities normal, atraumatic, no cyanosis or edema  Lab Results: Recent Labs    11/17/17 0243 11/18/17 0405  WBC 8.2 9.5  HGB 9.8* 10.8*  HCT 29.4* 33.4*  PLT 255 284   BMET:  Recent Labs    11/17/17 0243 11/18/17 0405  NA 140 140  K 3.3* 3.9  CL 107 105  CO2 21* 24  GLUCOSE 120* 57*  BUN 51* 29*  CREATININE 7.13* 5.10*  CALCIUM 6.5* 7.4*   No results for input(s): PTH in the last 72 hours. Iron Studies: No results for input(s):  IRON, TIBC, TRANSFERRIN, FERRITIN in the last 72 hours. Studies/Results: Dg Chest Port 1 View  Result Date: 11/17/2017 CLINICAL DATA:  Pneumonia. Congestive heart failure. Chronic kidney disease stage 5. EXAM: PORTABLE CHEST 1 VIEW COMPARISON:  11/14/2017 FINDINGS: Heart size remains stable. New left jugular dual-lumen central venous catheter is seen, with tip overlying the mid right atrium. No pneumothorax visualized. The previously seen areas of airspace disease in both upper lobes and right lung base have decreased since previous study. No evidence of pneumothorax or pleural effusion. IMPRESSION: Decreased bilateral upper lobe and right basilar airspace opacity, consistent with resolving pneumonia. Electronically Signed   By: Earle Gell M.D.   On: 11/17/2017 14:55    Scheduled: . atorvastatin  40 mg Oral QPM  . cefpodoxime  200 mg Oral Q24H  . enoxaparin (LOVENOX) injection  30 mg Subcutaneous QHS  . fluticasone  1 spray Each Nare Daily  . insulin aspart  0-9 Units Subcutaneous TID WC  . metoprolol tartrate  25 mg Oral BID  . sodium chloride flush  3 mL Intravenous Q12H     LOS: 5 days   Luster Hechler A 11/19/2017,12:49 PM

## 2017-11-19 NOTE — Procedures (Signed)
Patient was seen on dialysis and the procedure was supervised.  BFR 400  Via vascath BP is  129/74.   Patient appears to be tolerating treatment well  Teryl Mcconaghy A 11/19/2017

## 2017-11-19 NOTE — Progress Notes (Addendum)
PROGRESS NOTE    [name]  ZOX:[id number] DOB: 10-Sep-1961 DOA: 11/14/2017 PCP: Mack Hook, MD  Brief Narrative:  [name] is an 56 y.o. female with DM. Blindness,  CKDV nearing dialysis who had been refusing dialysis followed by [facility] Dr. Justin Mend. She presented with dyspnea, cough but denies myalgias, rigors, fevers. CT chest with multiple pulmonary infiltrrates present and cardiomegaly without pulmonary edema on CXR. creatinine is 10, bicarbonate is 13 Renal consulting, started HD this admission  Assessment & Plan:    Multifocal pneumonia -treated with ceftriaxone and azithromycin -cT chest notes consolidation not pulmonary edema -Transition to oral cefpodoxime yesterday we'll complete 7 day course of antibiotics -FU blood and sputum cultures-no growth -follow-up chest x-ray in 4-6 weeks    Progressive CKD5/metabolic acidosis -Now ESRD -presenting with a creatinine of 10 and bicarbonate of 13 -Finally agreeable to start hemodialysis, renal consulting, IR consulted, status post temporary HD catheter 11/29 -Status post first hemodialysis treatment 11/29, then again 12/1 -Diet education -Per Renal -plan for AV fistula per VVS tomorrow -Patient plans to return to [place] on Sunday 12/9 via airplane and has made arrangements for dialysis and has discussed with the nephrologist there, likely continue HD in hospital until travel time.    Diabetes mellitus  -glipizide on hold -Continue sliding scale insulin -HbA1c in August was 7.3, Will repeat    Essential hypertension -norvasc on hold, BP stable   Anemia of chronic disease -And iron deficiency noted on anemia panel -No overt blood loss noted -Status post  2units PRBC this admission -Needs iron and EPO with dialysis per renal  DVT prophylaxis: lovenox Code Status: Full Code Family Communication:  None at bedside, seen on HD  Disposition Plan: Home when stable, CLIPPED for HD, infact she is  planning to go to [place] on 12/9  Consultants:   Renal   Procedures:   Antimicrobials:  Rocephin/zithromax 11/18  Subjective: -K, no complaints, breathing improving  Objective: Vitals:   11/18/17 2148 11/19/17 0558 11/19/17 0559 11/19/17 0634  BP: (!) 154/70 (!) 168/66 (!) 167/68 140/70  Pulse: 69 63 62   Resp: 16 12    Temp: 98.6 F (37 C) 98.2 F (36.8 C)    TempSrc: Oral Oral    SpO2: 99% 100%    Weight:      Height:        Intake/Output Summary (Last 24 hours) at 11/19/2017 1345 Last data filed at 11/19/2017 0551 Gross per 24 hour  Intake 180 ml  Output -  Net 180 ml   Filed Weights   11/15/17 1545 11/15/17 1845 11/17/17 0734  Weight: 60.6 kg (133 lb 9.6 oz) 59.9 kg (132 lb 0.9 oz) 59 kg (130 lb 1.1 oz)    Examination: Gen: Awake, Alert, Oriented X 3, blind female, no distress  HEENT: PERRLA, Neck supple, no JVD Lungs: Rhonchi the right middle and lower lung CVS: RRR,No Gallops,Rubs or new Murmurs Abd: soft, Non tender, non distended, BS present Extremities: No Cyanosis, Clubbing or edema Skin: no new rashes  Data Reviewed:   CBC: Recent Labs  Lab 11/15/17 1414 11/15/17 1630 11/16/17 0406 11/17/17 0243 11/18/17 0405  WBC 8.8 9.0 8.4 8.2 9.5  HGB 8.1* 7.4* 7.3* 9.8* 10.8*  HCT 24.7* 22.5* 22.4* 29.4* 33.4*  MCV 86.1 85.6 85.5 84.7 87.4  PLT 273 278 255 255 811   Basic Metabolic Panel: Recent Labs  Lab 11/14/17 1411 11/15/17 1630 11/15/17 2152 11/17/17 0243 11/18/17 0405  NA 140  140 136 140 140  K 4.5 4.0 4.0 3.3* 3.9  CL 114* 114* 104 107 105  CO2 13* 14* 20* 21* 24  GLUCOSE 58* 94 125* 120* 57*  BUN 80* 85* 37* 51* 29*  CREATININE 10.53* 10.56* 5.79* 7.13* 5.10*  CALCIUM 6.8* 6.3* 7.2* 6.5* 7.4*  MG  --   --  2.1  --   --   PHOS  --  5.3*  --   --   --    GFR: Estimated Creatinine Clearance: 10.3 mL/min (A) (by C-G formula based on SCr of 5.1 mg/dL (H)). Liver Function Tests: Recent Labs  Lab 11/15/17 1630 11/15/17 2152    AST  --  40  ALT  --  34  ALKPHOS  --  243*  BILITOT  --  1.7*  PROT  --  6.2*  ALBUMIN 2.8* 2.6*   No results for input(s): LIPASE, AMYLASE in the last 168 hours. No results for input(s): AMMONIA in the last 168 hours. Coagulation Profile: Recent Labs  Lab 11/14/17 1743  INR 1.18   Cardiac Enzymes: No results for input(s): CKTOTAL, CKMB, CKMBINDEX, TROPONINI in the last 168 hours. BNP (last 3 results) No results for input(s): PROBNP in the last 8760 hours. HbA1C: No results for input(s): HGBA1C in the last 72 hours. CBG: Recent Labs  Lab 11/18/17 1246 11/18/17 1649 11/18/17 2144 11/19/17 0824 11/19/17 1229  GLUCAP 221* 228* 177* 137* 236*   Lipid Profile: No results for input(s): CHOL, HDL, LDLCALC, TRIG, CHOLHDL, LDLDIRECT in the last 72 hours. Thyroid Function Tests: No results for input(s): TSH, T4TOTAL, FREET4, T3FREE, THYROIDAB in the last 72 hours. Anemia Panel: No results for input(s): VITAMINB12, FOLATE, FERRITIN, TIBC, IRON, RETICCTPCT in the last 72 hours. Urine analysis:    Component Value Date/Time   COLORURINE STRAW (A) 11/16/2017 0301   APPEARANCEUR CLEAR 11/16/2017 0301   LABSPEC 1.004 (L) 11/16/2017 0301   PHURINE 8.0 11/16/2017 0301   GLUCOSEU 50 (A) 11/16/2017 0301   HGBUR SMALL (A) 11/16/2017 0301   BILIRUBINUR NEGATIVE 11/16/2017 0301   KETONESUR 5 (A) 11/16/2017 0301   PROTEINUR 100 (A) 11/16/2017 0301   UROBILINOGEN 0.2 10/18/2011 1003   NITRITE NEGATIVE 11/16/2017 0301   LEUKOCYTESUR MODERATE (A) 11/16/2017 0301   Sepsis Labs: @LABRCNTIP (procalcitonin:4,lacticidven:4)  ) Recent Results (from the past 240 hour(s))  Culture, blood (routine x 2)     Status: None   Collection Time: 11/14/17  5:53 PM  Result Value Ref Range Status   Specimen Description BLOOD LEFT HAND  Final   Special Requests   Final    Blood Culture results may not be optimal due to an inadequate volume of blood received in culture bottles BOTTLES DRAWN AEROBIC  AND ANAEROBIC   Culture NO GROWTH 5 DAYS  Final   Report Status 11/19/2017 FINAL  Final  Culture, blood (routine x 2)     Status: None   Collection Time: 11/14/17  6:37 PM  Result Value Ref Range Status   Specimen Description BLOOD RIGHT ANTECUBITAL  Final   Special Requests   Final    Blood Culture adequate volume BOTTLES DRAWN AEROBIC AND ANAEROBIC   Culture NO GROWTH 5 DAYS  Final   Report Status 11/19/2017 FINAL  Final  Urine culture     Status: Abnormal   Collection Time: 11/16/17  3:02 AM  Result Value Ref Range Status   Specimen Description URINE, RANDOM  Final   Special Requests NONE  Final   Culture MULTIPLE  SPECIES PRESENT, SUGGEST RECOLLECTION (A)  Final   Report Status 11/17/2017 FINAL  Final         Radiology Studies: Dg Chest Port 1 View  Result Date: 11/17/2017 CLINICAL DATA:  Pneumonia. Congestive heart failure. Chronic kidney disease stage 5. EXAM: PORTABLE CHEST 1 VIEW COMPARISON:  11/14/2017 FINDINGS: Heart size remains stable. New left jugular dual-lumen central venous catheter is seen, with tip overlying the mid right atrium. No pneumothorax visualized. The previously seen areas of airspace disease in both upper lobes and right lung base have decreased since previous study. No evidence of pneumothorax or pleural effusion. IMPRESSION: Decreased bilateral upper lobe and right basilar airspace opacity, consistent with resolving pneumonia. Electronically Signed   By: Earle Gell M.D.   On: 11/17/2017 14:55        Scheduled Meds: . atorvastatin  40 mg Oral QPM  . cefpodoxime  200 mg Oral Q24H  . enoxaparin (LOVENOX) injection  30 mg Subcutaneous QHS  . fluticasone  1 spray Each Nare Daily  . insulin aspart  0-9 Units Subcutaneous TID WC  . metoprolol tartrate  25 mg Oral BID  . sodium chloride flush  3 mL Intravenous Q12H   Continuous Infusions: . sodium chloride    . [START ON 11/20/2017]  ceFAZolin (ANCEF) IV    . ferric gluconate (FERRLECIT/NULECIT) IV  Stopped (11/19/17 1250)     LOS: 5 days    Time spent: 68min    Domenic Polite, MD [facility] Page via [email or web address], password [email or web address] After 7PM please contact night-coverage  11/19/2017, 1:45 PM

## 2017-11-19 NOTE — Evaluation (Addendum)
Occupational Therapy Evaluation Patient Details Name: [name] MRN: [id number] DOB: 06-01-1961 Today's Date: 11/19/2017    History of Present Illness [name] is a 56 y.o. female with long hx of DM 2 > 20 yrs, HTN and CKD f/b [facility].  She presents to ED w/ complaint of pain in her back and chest, also SOB.  CXR showed multifocal pulmonary infiltrates and cardiomegaly without pulmonary edema.  CT chest showed multifocal dense infiltrates R > L w/ R sided pleural effusion. Creat here is 10.5. Received HD 11/19/17   Clinical Impression   PTA pt independent in ADL and mobility with cane (inside home) - family assists with mobility outside of home due to vision impairment (Pt legally blind) Pt complaining of persistent back pain (existed PTA) that is cervical and goes through L shoulder and down to ribs on L side. Pt is at baseline for ADL and will have 24 hour assist both at her home in the [place] and after returning to [place]. No questions or concerns for OT from Pt and son (who was present during session). Education complete, OT to sign off at this time. Thank you for the opportunity to serve this pateint    Follow Up Recommendations  No OT follow up;Supervision/Assistance - 24 hour    Equipment Recommendations  None recommended by OT    Recommendations for Other Services       Precautions / Restrictions Precautions Precautions: Other (comment)(legally blind) Restrictions Weight Bearing Restrictions: No      Mobility Bed Mobility Overal bed mobility: Needs Assistance Bed Mobility: Supine to Sit     Supine to sit: HOB elevated;Min assist     General bed mobility comments: HHA for moving to EOB due to limited vision  Transfers Overall transfer level: Needs assistance Equipment used: Rolling walker (2 wheeled) Transfers: Sit to/from Stand Sit to Stand: Min assist         General transfer comment: min A for guiding hands to RW, good power up and  steadying in standing      Balance Overall balance assessment: Needs assistance Sitting-balance support: Feet unsupported;No upper extremity supported Sitting balance-Leahy Scale: Good     Standing balance support: Bilateral upper extremity supported Standing balance-Leahy Scale: Good                             ADL either performed or assessed with clinical judgement   ADL Overall ADL's : At baseline                                       General ADL Comments: Pt able to demonstrate LB dressing (bringing feet up to knees) toilet transfer     Vision Baseline Vision/History: Legally blind       Perception     Praxis      Pertinent Vitals/Pain Pain Assessment: Faces Faces Pain Scale: Hurts a little bit Pain Location: back, shoulders with movement L>R, L LE numbness Pain Descriptors / Indicators: Numbness Pain Intervention(s): Monitored during session;Repositioned     Hand Dominance     Extremity/Trunk Assessment Upper Extremity Assessment Upper Extremity Assessment: Overall WFL for tasks assessed;Generalized weakness   Lower Extremity Assessment Lower Extremity Assessment: Defer to PT evaluation LLE Deficits / Details: hip strength grossly 4/5, knee extesion grossly 4/5, knee ext 5/5, L LE feels numb LLE  Sensation: (numb )       Communication Communication Communication: Prefers language other than English(Spanish, interpreter present throughout session )   Cognition Arousal/Alertness: Awake/alert Behavior During Therapy: WFL for tasks assessed/performed Overall Cognitive Status: Within Functional Limits for tasks assessed                                     General Comments  On 2L O2 via nasal cannula at entry, SaO2 97% O2, removed O2 on RA sitting EoB SaO2 91% O2, with ambuatation SaO2 increased to 94%O2, left supplemental O2 off while seated in recliner after session    Exercises     Shoulder Instructions       Home Living Family/patient expects to be discharged to:: Private residence Living Arrangements: Spouse/significant other Available Help at Discharge: Family;Available PRN/intermittently Type of Home: House Home Access: Stairs to enter [employer] of Steps: 2   Home Layout: One level     Bathroom Shower/Tub: Teacher, early years/pre: Standard     Home Equipment: Cane - single point          Prior Functioning/Environment Level of Independence: Independent with assistive device(s);Needs assistance  Gait / Transfers Assistance Needed: independent in her home with [facility], requires assist due to limited vision in unfamiliar places ADL's / Homemaking Assistance Needed: independant            OT Problem List:        OT Treatment/Interventions:      OT Goals(Current goals can be found in the care plan section) Acute Rehab OT Goals Patient Stated Goal: go to [place]; be with more family OT Goal Formulation: With patient/family Time For Goal Achievement: 12/03/17 Potential to Achieve Goals: Good  OT Frequency:     Barriers to D/C:            Co-evaluation PT/OT/SLP Co-Evaluation/Treatment: Yes Reason for Co-Treatment: Other (comment)(use of interpreter) PT goals addressed during session: Mobility/safety with mobility OT goals addressed during session: ADL's and self-care      AM-PAC PT "6 Clicks" Daily Activity     Outcome Measure Help from another person eating meals?: None Help from another person taking care of personal grooming?: None Help from another person toileting, which includes using toliet, bedpan, or urinal?: A Little Help from another person bathing (including washing, rinsing, drying)?: None Help from another person to put on and taking off regular upper body clothing?: None Help from another person to put on and taking off regular lower body clothing?: None 6 Click Score: 23   End of Session Equipment Utilized During  Treatment: Gait belt;Rolling walker Nurse Communication: Mobility status  Activity Tolerance: Patient tolerated treatment well Patient left: in chair;with call bell/phone within reach;with family/visitor present                   Time: 1006-1046 OT Time Calculation (min): 40 min Charges:  OT General Charges $OT Visit: 1 Visit OT Evaluation $OT Eval Moderate Complexity: 1 Mod G-Codes:     [name] OTR/L [phone]  [name] 11/19/2017, 12:15 PM

## 2017-11-20 ENCOUNTER — Inpatient Hospital Stay (HOSPITAL_COMMUNITY): Payer: Self-pay | Admitting: Anesthesiology

## 2017-11-20 ENCOUNTER — Encounter (HOSPITAL_COMMUNITY): Payer: Self-pay | Admitting: Surgery

## 2017-11-20 ENCOUNTER — Encounter (HOSPITAL_COMMUNITY)
Admission: EM | Disposition: A | Discharge status: Home / Self Care | Payer: Self-pay | Source: Home / Self Care | Attending: Internal Medicine

## 2017-11-20 DIAGNOSIS — N184 Chronic kidney disease, stage 4 (severe): Secondary | ICD-10-CM

## 2017-11-20 HISTORY — PX: AV FISTULA PLACEMENT: SHX1204

## 2017-11-20 LAB — GLUCOSE, CAPILLARY
GLUCOSE-CAPILLARY: 82 mg/dL (ref 65–99)
GLUCOSE-CAPILLARY: 161 mg/dL — AB (ref 65–99)
GLUCOSE-CAPILLARY: 172 mg/dL — AB (ref 65–99)
GLUCOSE-CAPILLARY: 223 mg/dL — AB (ref 65–99)
Glucose-Capillary: 76 mg/dL (ref 65–99)
Glucose-Capillary: 165 mg/dL — ABNORMAL HIGH (ref 65–99)

## 2017-11-20 SURGERY — INSERTION OF ARTERIOVENOUS (AV) GORE-TEX GRAFT ARM
Anesthesia: Monitor Anesthesia Care | Site: Arm Upper | Laterality: Left

## 2017-11-20 MED ORDER — HEPARIN SODIUM (PORCINE) 1000 UNIT/ML IJ SOLN
INTRAMUSCULAR | Status: AC
  Filled 2017-11-20: qty 2

## 2017-11-20 MED ORDER — PROPOFOL 500 MG/50ML IV EMUL
INTRAVENOUS | Status: AC
  Filled 2017-11-20: qty 100

## 2017-11-20 MED ORDER — DEXTROSE 50 % IV SOLN
INTRAVENOUS | Status: AC
  Administered 2017-11-20: 50 mL via INTRAVENOUS
  Filled 2017-11-20: qty 50

## 2017-11-20 MED ORDER — LIDOCAINE HCL 1 % IJ SOLN
INTRAMUSCULAR | Status: AC
  Filled 2017-11-20: qty 20

## 2017-11-20 MED ORDER — SODIUM CHLORIDE 0.9 % IV SOLN
INTRAVENOUS | Status: DC | PRN
  Administered 2017-11-20 (×2): via INTRAVENOUS

## 2017-11-20 MED ORDER — DEXAMETHASONE SODIUM PHOSPHATE 10 MG/ML IJ SOLN
INTRAMUSCULAR | Status: DC | PRN
  Administered 2017-11-20: 5 mg via INTRAVENOUS

## 2017-11-20 MED ORDER — PROPOFOL 500 MG/50ML IV EMUL
INTRAVENOUS | Status: DC | PRN
Start: 2017-11-20 — End: 2017-11-20
  Administered 2017-11-20: 25 ug/kg/min via INTRAVENOUS

## 2017-11-20 MED ORDER — PROPOFOL 1000 MG/100ML IV EMUL
INTRAVENOUS | Status: AC
  Filled 2017-11-20: qty 200

## 2017-11-20 MED ORDER — LIDOCAINE-EPINEPHRINE (PF) 1 %-1:200000 IJ SOLN
INTRAMUSCULAR | Status: DC | PRN
  Administered 2017-11-20: 25 mL

## 2017-11-20 MED ORDER — PROTAMINE SULFATE 10 MG/ML IV SOLN
INTRAVENOUS | Status: DC | PRN
  Administered 2017-11-20: 30 mg via INTRAVENOUS

## 2017-11-20 MED ORDER — HEPARIN SODIUM (PORCINE) 1000 UNIT/ML IJ SOLN
INTRAMUSCULAR | Status: DC | PRN
  Administered 2017-11-20: 5000 [IU] via INTRAVENOUS

## 2017-11-20 MED ORDER — ONDANSETRON HCL 4 MG/2ML IJ SOLN
INTRAMUSCULAR | Status: AC
  Filled 2017-11-20: qty 2

## 2017-11-20 MED ORDER — MORPHINE SULFATE (PF) 4 MG/ML IV SOLN: 4.0000 mg | INTRAVENOUS | Status: DC | PRN

## 2017-11-20 MED ORDER — ONDANSETRON HCL 4 MG/2ML IJ SOLN
INTRAMUSCULAR | Status: DC | PRN
  Administered 2017-11-20: 4 mg via INTRAVENOUS

## 2017-11-20 MED ORDER — CEFPODOXIME PROXETIL 200 MG PO TABS
200.0000 mg | ORAL_TABLET | ORAL | Status: AC
  Administered 2017-11-20: 200 mg via ORAL
  Filled 2017-11-20: qty 1

## 2017-11-20 MED ORDER — MIDAZOLAM HCL 5 MG/5ML IJ SOLN
INTRAMUSCULAR | Status: DC | PRN
  Administered 2017-11-20: 1 mg via INTRAVENOUS

## 2017-11-20 MED ORDER — PROPOFOL 10 MG/ML IV BOLUS
INTRAVENOUS | Status: AC
  Filled 2017-11-20: qty 40

## 2017-11-20 MED ORDER — PHENYLEPHRINE HCL 10 MG/ML IJ SOLN
INTRAVENOUS | Status: DC | PRN
  Administered 2017-11-20: 25 ug/min via INTRAVENOUS

## 2017-11-20 MED ORDER — FENTANYL CITRATE (PF) 100 MCG/2ML IJ SOLN
INTRAMUSCULAR | Status: DC | PRN
Start: 2017-11-20 — End: 2017-11-20
  Administered 2017-11-20 (×2): 25 ug via INTRAVENOUS

## 2017-11-20 MED ORDER — LIDOCAINE-EPINEPHRINE (PF) 1 %-1:200000 IJ SOLN
INTRAMUSCULAR | Status: AC
  Filled 2017-11-20: qty 30

## 2017-11-20 MED ORDER — PROTAMINE SULFATE 10 MG/ML IV SOLN
INTRAVENOUS | Status: AC
  Filled 2017-11-20: qty 5

## 2017-11-20 MED ORDER — SODIUM CHLORIDE 0.9 % IV SOLN
INTRAVENOUS | Status: DC | PRN
  Administered 2017-11-20: 500 mL

## 2017-11-20 MED ORDER — DEXTROSE 50 % IV SOLN
50.0000 mL | Freq: Once | INTRAVENOUS | Status: AC
Start: 2017-11-20 — End: 2017-11-20
  Administered 2017-11-20: 50 mL via INTRAVENOUS
  Filled 2017-11-20: qty 50

## 2017-11-20 MED ORDER — OXYCODONE-ACETAMINOPHEN 5-325 MG PO TABS
1.0000 | ORAL_TABLET | ORAL | Status: DC | PRN
  Administered 2017-11-22: 1 via ORAL
  Filled 2017-11-20: qty 1

## 2017-11-20 MED ORDER — 0.9 % SODIUM CHLORIDE (POUR BTL) OPTIME
TOPICAL | Status: DC | PRN
  Administered 2017-11-20: 1000 mL

## 2017-11-20 MED ORDER — MIDAZOLAM HCL 2 MG/2ML IJ SOLN
INTRAMUSCULAR | Status: AC
  Filled 2017-11-20: qty 2

## 2017-11-20 MED ORDER — FENTANYL CITRATE (PF) 250 MCG/5ML IJ SOLN
INTRAMUSCULAR | Status: AC
  Filled 2017-11-20: qty 5

## 2017-11-20 SURGICAL SUPPLY — 39 items
ARMBAND PINK RESTRICT EXTREMIT (MISCELLANEOUS) ×8 IMPLANT
CANISTER SUCT 3000ML PPV (MISCELLANEOUS) ×4 IMPLANT
CANNULA VESSEL 3MM 2 BLNT TIP (CANNULA) ×4 IMPLANT
CLIP TI WIDE RED SMALL 6 (CLIP) ×4 IMPLANT
CLIP VESOCCLUDE MED 6/CT (CLIP) ×4 IMPLANT
CLIP VESOCCLUDE SM WIDE 6/CT (CLIP) ×4 IMPLANT
COVER PROBE W GEL 5X96 (DRAPES) IMPLANT
DECANTER SPIKE VIAL GLASS SM (MISCELLANEOUS) ×4 IMPLANT
DERMABOND ADVANCED (GAUZE/BANDAGES/DRESSINGS) ×4
DERMABOND ADVANCED .7 DNX12 (GAUZE/BANDAGES/DRESSINGS) ×4 IMPLANT
ELECT REM PT RETURN 9FT ADLT (ELECTROSURGICAL) ×4
ELECTRODE REM PT RTRN 9FT ADLT (ELECTROSURGICAL) ×2 IMPLANT
GLOVE BIO SURGEON STRL SZ7 (GLOVE) ×4 IMPLANT
GLOVE BIO SURGEON STRL SZ7.5 (GLOVE) ×4 IMPLANT
GLOVE BIOGEL PI IND STRL 6.5 (GLOVE) ×4 IMPLANT
GLOVE BIOGEL PI IND STRL 8 (GLOVE) ×2 IMPLANT
GLOVE BIOGEL PI INDICATOR 6.5 (GLOVE) ×4
GLOVE BIOGEL PI INDICATOR 8 (GLOVE) ×2
GLOVE BIOGEL PI ORTHO PRO 7.5 (GLOVE) ×2
GLOVE PI ORTHO PRO STRL 7.5 (GLOVE) ×2 IMPLANT
GLOVE SURG SS PI 7.0 STRL IVOR (GLOVE) ×4 IMPLANT
GOWN STRL REUS W/ TWL LRG LVL3 (GOWN DISPOSABLE) ×6 IMPLANT
GOWN STRL REUS W/TWL LRG LVL3 (GOWN DISPOSABLE) ×6
GRAFT GORETEX STRT 4-7X45 (Vascular Products) ×4 IMPLANT
KIT BASIN OR (CUSTOM PROCEDURE TRAY) ×4 IMPLANT
KIT ROOM TURNOVER OR (KITS) ×4 IMPLANT
NEEDLE HYPO 25GX1X1/2 BEV (NEEDLE) ×4 IMPLANT
NS IRRIG 1000ML POUR BTL (IV SOLUTION) ×4 IMPLANT
PACK CV ACCESS (CUSTOM PROCEDURE TRAY) ×4 IMPLANT
PAD ARMBOARD 7.5X6 YLW CONV (MISCELLANEOUS) ×8 IMPLANT
SPONGE SURGIFOAM ABS GEL 100 (HEMOSTASIS) IMPLANT
SUT PROLENE 6 0 BV (SUTURE) ×12 IMPLANT
SUT VIC AB 3-0 SH 27 (SUTURE) ×4
SUT VIC AB 3-0 SH 27X BRD (SUTURE) ×4 IMPLANT
SUT VIC AB 4-0 PS2 27 (SUTURE) ×8 IMPLANT
SUT VICRYL 4-0 PS2 18IN ABS (SUTURE) IMPLANT
TOWEL GREEN STERILE (TOWEL DISPOSABLE) ×4 IMPLANT
UNDERPAD 30X30 (UNDERPADS AND DIAPERS) ×4 IMPLANT
WATER STERILE IRR 1000ML POUR (IV SOLUTION) ×4 IMPLANT

## 2017-11-20 NOTE — Anesthesia Preprocedure Evaluation (Signed)
Anesthesia Evaluation  Patient identified by MRN, date of birth, ID band Patient awake    Reviewed: Allergy & Precautions, NPO status , Patient's Chart, lab work & pertinent test results  Airway Mallampati: II  TM Distance: >3 FB     Dental   Pulmonary pneumonia,    breath sounds clear to auscultation       Cardiovascular hypertension,  Rhythm:Regular Rate:Normal     Neuro/Psych    GI/Hepatic negative GI ROS, Neg liver ROS,   Endo/Other  diabetes  Renal/GU Renal disease     Musculoskeletal   Abdominal   Peds  Hematology   Anesthesia Other Findings   Reproductive/Obstetrics                             Anesthesia Physical Anesthesia Plan  ASA: III  Anesthesia Plan: MAC   Post-op Pain Management:    Induction: Intravenous  PONV Risk Score and Plan: 2 and Treatment may vary due to age or medical condition  Airway Management Planned: Simple Face Mask  Additional Equipment:   Intra-op Plan:   Post-operative Plan:   Informed Consent: I have reviewed the patients History and Physical, chart, labs and discussed the procedure including the risks, benefits and alternatives for the proposed anesthesia with the patient or authorized representative who has indicated his/her understanding and acceptance.   Dental advisory given  Plan Discussed with: CRNA and Anesthesiologist  Anesthesia Plan Comments:         Anesthesia Quick Evaluation

## 2017-11-20 NOTE — Progress Notes (Signed)
PROGRESS NOTE    [name]  QIO:[id number] DOB: 01-Jan-1961 DOA: 11/14/2017 PCP: Mack Hook, MD  Brief Narrative:  [name] is an 56 y.o. female with DM. Blindness,  CKDV nearing dialysis who had been refusing dialysis followed by [facility] Dr. Justin Mend. She presented with dyspnea, cough but denies myalgias, rigors, fevers. CT chest with multiple pulmonary infiltrrates present and cardiomegaly without pulmonary edema on CXR. creatinine is 10, bicarbonate is 13 Renal consulting, started HD this admission 12/4 AVF per VVS -will likely stay inpatient until moves back to [place] this weekend  Assessment & Plan:    Multifocal pneumonia -CT chest noted consolidation not pulmonary edema --treated with ceftriaxone and azithromycin, then transitioned to oral cefpodoxime 12/2, completes 7 day course of antibiotics today, DC abx after todays dose -Blood and sputum cultures-no growth -follow-up chest x-ray in 4-6 weeks    Progressive CKD5/metabolic acidosis -Now ESRD -presenting with a creatinine of 10 and bicarbonate of 13 -Finally agreeable to start hemodialysis, renal consulted, IR consulted, status post temporary HD catheter 11/29 -Status post first hemodialysis treatment 11/29, then again 12/1, now ongoing -Diet education -Per Renal -plan for AV fistula per VVS today -Patient plans to return to [place] on Sunday 12/9 via airplane and has made arrangements for dialysis and has discussed with the nephrologist there, likely continue HD in hospital until travel time.    Diabetes mellitus  -glipizide on hold -Continue sliding scale insulin -HbA1c in August was 7.3, stop Glipizide at DC    Essential hypertension -norvasc on hold, BP stable -continue metoprolol   Anemia of chronic disease -And iron deficiency noted on anemia panel -No overt blood loss noted -Status post  2units PRBC this admission -Needs iron and EPO with dialysis per renal -Hb stable now  DVT  prophylaxis: lovenox Code Status: Full Code Family Communication:  None at bedside, seen on HD  Disposition Plan: DC just before travel, she is planning to fly to [place] on 12/9  Consultants:   Renal  VVS   Renal   Procedures: Temp HD catheter -IR  AVF -VVS 12/4  Antimicrobials:  Rocephin/zithromax 11/18  Subjective: -feels ok, no distress, breathing improving  Objective: Vitals:   11/20/17 0918 11/20/17 1230 11/20/17 1238 11/20/17 1258  BP: 138/68 (!) 142/67 (!) 125/59 (!) 125/59  Pulse: 76 78 74   Resp: 17 18 (!) 22 20  Temp: 98.6 F (37 C) 98 F (36.7 C) 98 F (36.7 C) 99.6 F (37.6 C)  TempSrc: Oral   Oral  SpO2: 97% 96% 92% 92%  Weight:      Height:        Intake/Output Summary (Last 24 hours) at 11/20/2017 1338 Last data filed at 11/20/2017 1229 Gross per 24 hour  Intake 383 ml  Output 725 ml  Net -342 ml   Filed Weights   11/17/17 0734 11/19/17 1415 11/19/17 1800  Weight: 59 kg (130 lb 1.1 oz) 57 kg (125 lb 10.6 oz) 56.4 kg (124 lb 5.4 oz)    Examination: Gen: Awake, Alert, Oriented X 3, frail blind female, no distress HEENT: PERRLA, Neck supple, no JVD Lungs: A few rhonchi in the right lung zones,  CVS: RRR,No Gallops,Rubs or new Murmurs Abd: soft, Non tender, non distended, BS present Extremities: No Cyanosis, Clubbing or edema Skin: no new rashes Right chest HD catheter noted   Data Reviewed:   CBC: Recent Labs  Lab 11/15/17 1630 11/16/17 0406 11/17/17 0243 11/18/17 0405 11/19/17 1432  WBC  9.0 8.4 8.2 9.5 9.1  HGB 7.4* 7.3* 9.8* 10.8* 9.8*  HCT 22.5* 22.4* 29.4* 33.4* 30.0*  MCV 85.6 85.5 84.7 87.4 86.0  PLT 278 255 255 284 542   Basic Metabolic Panel: Recent Labs  Lab 11/15/17 1630 11/15/17 2152 11/17/17 0243 11/18/17 0405 11/19/17 1432  NA 140 136 140 140 139  K 4.0 4.0 3.3* 3.9 3.7  CL 114* 104 107 105 105  CO2 14* 20* 21* 24 22  GLUCOSE 94 125* 120* 57* 110*  BUN 85* 37* 51* 29* 51*  CREATININE 10.56* 5.79*  7.13* 5.10* 6.80*  CALCIUM 6.3* 7.2* 6.5* 7.4* 6.8*  MG  --  2.1  --   --   --   PHOS 5.3*  --   --   --  6.1*   GFR: Estimated Creatinine Clearance: 7.1 mL/min (A) (by C-G formula based on SCr of 6.8 mg/dL (H)). Liver Function Tests: Recent Labs  Lab 11/15/17 1630 11/15/17 2152 11/19/17 1432  AST  --  40  --   ALT  --  34  --   ALKPHOS  --  243*  --   BILITOT  --  1.7*  --   PROT  --  6.2*  --   ALBUMIN 2.8* 2.6* 2.9*   No results for input(s): LIPASE, AMYLASE in the last 168 hours. No results for input(s): AMMONIA in the last 168 hours. Coagulation Profile: Recent Labs  Lab 11/14/17 1743  INR 1.18   Cardiac Enzymes: No results for input(s): CKTOTAL, CKMB, CKMBINDEX, TROPONINI in the last 168 hours. BNP (last 3 results) No results for input(s): PROBNP in the last 8760 hours. HbA1C: No results for input(s): HGBA1C in the last 72 hours. CBG: Recent Labs  Lab 11/19/17 2105 11/20/17 0752 11/20/17 0957 11/20/17 1230 11/20/17 1303  GLUCAP 99 82 76 172* 161*   Lipid Profile: No results for input(s): CHOL, HDL, LDLCALC, TRIG, CHOLHDL, LDLDIRECT in the last 72 hours. Thyroid Function Tests: No results for input(s): TSH, T4TOTAL, FREET4, T3FREE, THYROIDAB in the last 72 hours. Anemia Panel: No results for input(s): VITAMINB12, FOLATE, FERRITIN, TIBC, IRON, RETICCTPCT in the last 72 hours. Urine analysis:    Component Value Date/Time   COLORURINE STRAW (A) 11/16/2017 0301   APPEARANCEUR CLEAR 11/16/2017 0301   LABSPEC 1.004 (L) 11/16/2017 0301   PHURINE 8.0 11/16/2017 0301   GLUCOSEU 50 (A) 11/16/2017 0301   HGBUR SMALL (A) 11/16/2017 0301   BILIRUBINUR NEGATIVE 11/16/2017 0301   KETONESUR 5 (A) 11/16/2017 0301   PROTEINUR 100 (A) 11/16/2017 0301   UROBILINOGEN 0.2 10/18/2011 1003   NITRITE NEGATIVE 11/16/2017 0301   LEUKOCYTESUR MODERATE (A) 11/16/2017 0301   Sepsis Labs: @LABRCNTIP (procalcitonin:4,lacticidven:4)  ) Recent Results (from the past 240  hour(s))  Culture, blood (routine x 2)     Status: None   Collection Time: 11/14/17  5:53 PM  Result Value Ref Range Status   Specimen Description BLOOD LEFT HAND  Final   Special Requests   Final    Blood Culture results may not be optimal due to an inadequate volume of blood received in culture bottles BOTTLES DRAWN AEROBIC AND ANAEROBIC   Culture NO GROWTH 5 DAYS  Final   Report Status 11/19/2017 FINAL  Final  Culture, blood (routine x 2)     Status: None   Collection Time: 11/14/17  6:37 PM  Result Value Ref Range Status   Specimen Description BLOOD RIGHT ANTECUBITAL  Final   Special Requests   Final  Blood Culture adequate volume BOTTLES DRAWN AEROBIC AND ANAEROBIC   Culture NO GROWTH 5 DAYS  Final   Report Status 11/19/2017 FINAL  Final  Urine culture     Status: Abnormal   Collection Time: 11/16/17  3:02 AM  Result Value Ref Range Status   Specimen Description URINE, RANDOM  Final   Special Requests NONE  Final   Culture MULTIPLE SPECIES PRESENT, SUGGEST RECOLLECTION (A)  Final   Report Status 11/17/2017 FINAL  Final  Surgical pcr screen     Status: None   Collection Time: 11/19/17  1:05 PM  Result Value Ref Range Status   MRSA, PCR NEGATIVE NEGATIVE Final   Staphylococcus aureus NEGATIVE NEGATIVE Final    Comment: (NOTE) The Xpert SA Assay (FDA approved for NASAL specimens in patients 73 years of age and older), is one component of a comprehensive surveillance program. It is not intended to diagnose infection nor to guide or monitor treatment.          Radiology Studies: No results found.      Scheduled Meds: . atorvastatin  40 mg Oral QPM  . cefpodoxime  200 mg Oral Q24H  . enoxaparin (LOVENOX) injection  30 mg Subcutaneous QHS  . fluticasone  1 spray Each Nare Daily  . insulin aspart  0-9 Units Subcutaneous TID WC  . metoprolol tartrate  25 mg Oral BID  . sodium chloride flush  3 mL Intravenous Q12H   Continuous Infusions: . sodium chloride    .  ferric gluconate (FERRLECIT/NULECIT) IV 125 mg (11/19/17 1642)     LOS: 6 days    Time spent: 105min    Domenic Polite, MD [facility] Page via [email or web address], password [email or web address] After 7PM please contact night-coverage  11/20/2017, 1:38 PM

## 2017-11-20 NOTE — Transfer of Care (Signed)
Immediate Anesthesia Transfer of Care Note  Patient: [name]  Procedure(s) Performed: INSERTION OF ARTERIOVENOUS (AV) GORE-TEX GRAFT ARM (Left Arm Upper)  Patient Location: PACU  Anesthesia Type:MAC  Level of Consciousness: awake, alert , oriented and sedated  Airway & Oxygen Therapy: Patient Spontanous Breathing and Patient connected to nasal cannula oxygen  Post-op Assessment: Report given to RN, Post -op Vital signs reviewed and stable and Patient moving all extremities  Post vital signs: Reviewed and stable  Last Vitals:  Vitals:   11/20/17 0605 11/20/17 0918  BP: (!) 166/64 138/68  Pulse: 70 76  Resp: 17 17  Temp: 37 C 37 C  SpO2: 94% 97%    Last Pain:  Vitals:   11/20/17 0918  TempSrc: Oral  PainSc:          Complications: No apparent anesthesia complications

## 2017-11-20 NOTE — Progress Notes (Signed)
Assessment/Plan: 1. ESRD, new start, planning to go back to [place] for dialysis at discharge 2. PNA - improving. 3. pSVT post HD 4 Anemia with iron defic- hgb over 10 - IV iron  5 Atrophic right kidney 6. HTN/vol- BP a little high- on metoprolol- no change for now- may come down further with more HD  Plan HD tomorrow- 4th treatment- pre labs ,  VVS placed AVG 12/4 .          Patient returning to [place] on 12/9 via airplane.  They have arranged f/u dialysis there and have been in contact with a nephrologist.  she probably will need to stay to get HD here on Wednesday and Friday before discharge.  She then tells me she wont see MD til next Wednesday - not ideal but is what we have   Subjective: Interval History: A little better- seen after OR- minimal pain  Objective: Vital signs in last 24 hours: Temp:  [98 F (36.7 C)-99.6 F (37.6 C)] 99.3 F (37.4 C) (12/04 1354) Pulse Rate:  [65-80] 80 (12/04 1620) Resp:  [14-22] 18 (12/04 1354) BP: (117-166)/(54-79) 139/54 (12/04 1620) SpO2:  [92 %-99 %] 99 % (12/04 1620) Weight:  [56.4 kg (124 lb 5.4 oz)] 56.4 kg (124 lb 5.4 oz) (12/03 1800) Weight change:   Intake/Output from previous day: 12/03 0701 - 12/04 0700 In: 3 [I.V.:3] Out: 400  Intake/Output this shift: Total I/O In: 620 [P.O.:270; I.V.:300; IV Piggyback:50] Out: 325 [Urine:300; Blood:25]  General appearance: alert and cooperative Back: negative, symmetric, no curvature. ROM normal. No CVA tenderness. Resp: clear to auscultation bilaterally Chest wall: no tenderness Cardio: regular rate and rhythm, S1, S2 normal, no murmur, click, rub or gallop Extremities: extremities normal, atraumatic, no cyanosis or edema- new left upper arm AVG wth bruit.  Right sided PC   Lab Results: Recent Labs    11/18/17 0405 11/19/17 1432  WBC 9.5 9.1  HGB 10.8* 9.8*  HCT 33.4* 30.0*  PLT 284 274   BMET:  Recent Labs    11/18/17 0405 11/19/17 1432  NA 140 139  K 3.9 3.7  CL 105  105  CO2 24 22  GLUCOSE 57* 110*  BUN 29* 51*  CREATININE 5.10* 6.80*  CALCIUM 7.4* 6.8*   No results for input(s): PTH in the last 72 hours. Iron Studies: No results for input(s): IRON, TIBC, TRANSFERRIN, FERRITIN in the last 72 hours. Studies/Results: No results found.  Scheduled: . atorvastatin  40 mg Oral QPM  . cefpodoxime  200 mg Oral Q24H  . enoxaparin (LOVENOX) injection  30 mg Subcutaneous QHS  . fluticasone  1 spray Each Nare Daily  . insulin aspart  0-9 Units Subcutaneous TID WC  . metoprolol tartrate  25 mg Oral BID  . sodium chloride flush  3 mL Intravenous Q12H     LOS: 6 days   Dontee Jaso A 11/20/2017,4:45 PM

## 2017-11-20 NOTE — Op Note (Signed)
    NAME: [name]    MRN: [id number] DOB: Feb 04, 1961    DATE OF OPERATION: 11/20/2017  PREOP DIAGNOSIS:    Stage IV chronic kidney disease  POSTOP DIAGNOSIS:    Same  PROCEDURE:    Left upper arm AV graft (4-7 mm PTFE graft)  SURGEON: Judeth Cornfield. Scot Dock, MD, FACS  ASSIST: Hartford Poli, PA  ANESTHESIA: Local with sedation  EBL: Minimal  INDICATIONS:    [name] is a 56 y.o. female who presents for new access.  FINDINGS:   The forearm and upper arm cephalic veins were not adequate for a fistula nor was the basilic vein.  TECHNIQUE:   The left arm was prepped and draped in the usual sterile fashion.  A longitudinal incision was made just above the antecubital level after the skin was anesthetized.  Here brachial vein was dissected free and was quite small.  The brachial artery was adjacent to this and was reasonable in size.  A separate longitudinal incision was made beneath the axilla after the skin was anesthetized.  High brachial vein was dissected free and ligated distally.  It irrigated up nicely with heparinized saline.  A 4-7 mm PTFE graft was tunneled in a loop fashion in the upper arm after the skin was anesthetized.  The patient was then heparinized.  The brachial artery was clamped proximally and distally and a longitudinal arteriotomy was made.  A segment of the 4 mm of the graft was excised, the graft slightly spatulated, and sewn end-to-side to the brachial artery using continuous 6-0 Prolene suture.  The graft and poorly appropriate length for anastomosis to the high brachial vein.  The graft was cut to appropriate length, spatulated, and sewn end to end to the vein using 2 continuous 6-0 Prolene sutures.  At the completion there was an excellent thrill in the graft and the radial and ulnar signal with the Doppler.  The heparin was partially reversed with protamine.  Hemostasis was obtained in the wounds.  The wounds were each closed  with a deep layer of 3-0 Vicryl the skin closed with 4-0 Vicryl.  Dermabond was applied.  The patient tolerated the procedure well and was transferred to the recovery room in stable condition.  All needle and sponge counts were correct.  Deitra Mayo, MD, FACS [facility]  DATE OF DICTATION:   11/20/2017

## 2017-11-20 NOTE — Progress Notes (Signed)
Patient back from OR. Alert and oriented x 4. No complaints of pain or other discomfort. AV fistula placed on LUA positive for bruit/thrill. CBG 161 and per sliding scale patient received 2 units Insuline. Will continue to monitor.

## 2017-11-20 NOTE — Interval H&P Note (Signed)
History and Physical Interval Note:  11/20/2017 9:50 AM  [name]  has presented today for surgery, with the diagnosis of end stage renal disease  The various methods of treatment have been discussed with the patient and family. After consideration of risks, benefits and other options for treatment, the patient has consented to  Procedure(s): ARTERIOVENOUS (AV) FISTULA CREATION VERSUS GRAFT (Left) as a surgical intervention .  The patient's history has been reviewed, patient examined, no change in status, stable for surgery.  I have reviewed the patient's chart and labs.  Questions were answered to the patient's satisfaction.     Deitra Mayo

## 2017-11-20 NOTE — Progress Notes (Signed)
Patient going to OR. Alert and; no acute distress noted, no complaints. Short Stay was called for report. Will continue to monitor.

## 2017-11-20 NOTE — Anesthesia Postprocedure Evaluation (Signed)
Anesthesia Post Note  Patient: [name]  Procedure(s) Performed: INSERTION OF ARTERIOVENOUS (AV) GORE-TEX GRAFT ARM (Left Arm Upper)     Patient location during evaluation: PACU Anesthesia Type: MAC Level of consciousness: awake Pain management: pain level controlled Vital Signs Assessment: post-procedure vital signs reviewed and stable Cardiovascular status: stable Anesthetic complications: no    Last Vitals:  Vitals:   11/20/17 1354 11/20/17 1620  BP: (!) 165/79 (!) 139/54  Pulse: 73 80  Resp: 18   Temp: 37.4 C   SpO2: 93% 99%    Last Pain:  Vitals:   11/20/17 1354  TempSrc: Oral  PainSc:                  [name]

## 2017-11-21 ENCOUNTER — Other Ambulatory Visit: Payer: Self-pay

## 2017-11-21 ENCOUNTER — Encounter (HOSPITAL_COMMUNITY): Payer: Self-pay | Admitting: General Practice

## 2017-11-21 DIAGNOSIS — N186 End stage renal disease: Secondary | ICD-10-CM

## 2017-11-21 DIAGNOSIS — Z992 Dependence on renal dialysis: Secondary | ICD-10-CM

## 2017-11-21 DIAGNOSIS — Z794 Long term (current) use of insulin: Secondary | ICD-10-CM

## 2017-11-21 LAB — RENAL FUNCTION PANEL
ALBUMIN: 2.8 g/dL — AB (ref 3.5–5.0)
ANION GAP: 16 — AB (ref 5–15)
BUN: 44 mg/dL — ABNORMAL HIGH (ref 6–20)
CALCIUM: 6.7 mg/dL — AB (ref 8.9–10.3)
CO2: 20 mmol/L — AB (ref 22–32)
Chloride: 99 mmol/L — ABNORMAL LOW (ref 101–111)
Creatinine, Ser: 6.06 mg/dL — ABNORMAL HIGH (ref 0.44–1.00)
GFR calc non Af Amer: 7 mL/min — ABNORMAL LOW (ref >60)
GFR, EST AFRICAN AMERICAN: 8 mL/min — AB (ref >60)
Glucose, Bld: 123 mg/dL — ABNORMAL HIGH (ref 65–99)
PHOSPHORUS: 6.3 mg/dL — AB (ref 2.5–4.6)
POTASSIUM: 4 mmol/L (ref 3.5–5.1)
SODIUM: 135 mmol/L (ref 135–145)

## 2017-11-21 LAB — GLUCOSE, CAPILLARY
GLUCOSE-CAPILLARY: 83 mg/dL (ref 65–99)
GLUCOSE-CAPILLARY: 88 mg/dL (ref 65–99)
Glucose-Capillary: 126 mg/dL — ABNORMAL HIGH (ref 65–99)
Glucose-Capillary: 112 mg/dL — ABNORMAL HIGH (ref 65–99)

## 2017-11-21 LAB — CBC
HEMATOCRIT: 28.6 % — AB (ref 36.0–46.0)
HEMOGLOBIN: 9.5 g/dL — AB (ref 12.0–15.0)
MCH: 28.5 pg (ref 26.0–34.0)
MCHC: 33.2 g/dL (ref 30.0–36.0)
MCV: 85.9 fL (ref 78.0–100.0)
Platelets: 261 10*3/uL (ref 150–400)
RBC: 3.33 MIL/uL — AB (ref 3.87–5.11)
RDW: 14.5 % (ref 11.5–15.5)
WBC: 11.4 10*3/uL — ABNORMAL HIGH (ref 4.0–10.5)

## 2017-11-21 LAB — CREATININE, SERUM
CREATININE: 6.02 mg/dL — AB (ref 0.44–1.00)
GFR calc Af Amer: 8 mL/min — ABNORMAL LOW (ref >60)
GFR, EST NON AFRICAN AMERICAN: 7 mL/min — AB (ref >60)

## 2017-11-21 NOTE — Progress Notes (Signed)
PROGRESS NOTE    [name]  TGG:[id number] DOB: 01-22-1961 DOA: 11/14/2017 PCP: Mack Hook, MD    Brief Narrative:  56 year old female who presented with dyspnea. Patient does have significant past med history of type 2 diabetes mellitus, hypertension and chronic kidney disease. Her dyspnea was associated with back pain chest pain. On the initial physical examination her blood pressure was 149/69, heart rate 98, respiratory 24, oxygen saturation 95%. Her lungs had rested right side, no wheezing or rhonchi, heart S1-S2 present, rhythmic, tachycardic, abdomen was soft nontender, no lower extremity edema. Sodium 140, potassium 4.5, chloride 114, bicarbonate 13, glucose 58, BUN 80, creatinine 10.5, white cell count 14.4, hemoglobin 6.7, hematocrit 21.2, platelets 290, chest x-ray with right upper lobe infiltrate, right lower lobe infiltrate and left upper lobe infiltrate, confirmed by CT chest which also showed a right pleural effusion. EKG sinus rhythm, rate of 100 bpm, normal axis, normal intervals.  Patient was admitted to the hospital working diagnosis of multi lobar pneumonia, in the setting of chronic kidney disease.  Assessment & Plan:   Principal Problem:   Multifocal pneumonia Active Problems:   Diabetes mellitus with hemoglobin A1c goal of 7.0%-8.0% (HCC)   Essential hypertension   CKD (chronic kidney disease) stage 5, GFR less than 15 ml/min (HCC)   Metabolic acidosis   Anemia   PNA (pneumonia)   Paroxysmal tachycardia (HCC)  1. Multilobar pneumonia. Clinically has been improving, patient has remained afebrile, wbc at 11,4. Blood cultures with no growth. Continue cefpodoxime.    2. Progressive chronic kidney disease stage V, patient has been deemed ESRD, had a temporary access on the right IJ, and left upper extremity AVG. Patient will follow with nephrology in [place].   3. Type 2 diabetes mellitus. Will continue glucose cover and monitoring with insulin  sliding scale, capillary glucose 172, 161, 223, 165, 126.   4. Hypertension. Blood pressure well controlled, continue bid metoprolol.   5. Anemia of chronic disease. On IV iron on HD. Hb and hct stable, no need for prbc transfusion.     DVT prophylaxis: heparin  Code Status:  full Family Communication: No family at the bedside Disposition Plan: home   Consultants:   Nephrology   Procedures:   AVG   HD catheter  Antimicrobials:   Cefazolin IV     Subjective: Patient on HD, feeling well, mild abdominal pain on the left upper quadrant, with no improving or worsening factors. Patient planing to return to [place] at discharge. No nausea or vomiting, no chest pain or dyspnea.   Objective: Vitals:   11/21/17 1100 11/21/17 1115 11/21/17 1130 11/21/17 1200  BP: (!) 97/49 (!) 91/47 (!) 120/54 121/60  Pulse: (!) 58 (!) 59 65 65  Resp:      Temp:      TempSrc:      SpO2:      Weight:      Height:        Intake/Output Summary (Last 24 hours) at 11/21/2017 1240 Last data filed at 11/21/2017 0523 Gross per 24 hour  Intake 300 ml  Output 50 ml  Net 250 ml   Filed Weights   11/19/17 1415 11/19/17 1800 11/21/17 0900  Weight: 57 kg (125 lb 10.6 oz) 56.4 kg (124 lb 5.4 oz) 54.7 kg (120 lb 9.5 oz)    Examination:   General: Not in pain or dyspnea, deconditioned Neurology: Awake and alert, non focal  E ENT: mild pallor, no icterus, oral mucosa moist Cardiovascular:  No JVD. S1-S2 present, rhythmic, no gallops, rubs, or murmurs. No lower extremity edema. Pulmonary: vesicular breath sounds bilaterally, adequate air movement, no wheezing, rhonchi or rales. Gastrointestinal. Abdomen protuberant, no organomegaly, non tender, no rebound or guarding Skin. No rashes Musculoskeletal: no joint deformities     Data Reviewed: I have personally reviewed following labs and imaging studies  CBC: Recent Labs  Lab 11/16/17 0406 11/17/17 0243 11/18/17 0405 11/19/17 1432  11/21/17 0900  WBC 8.4 8.2 9.5 9.1 11.4*  HGB 7.3* 9.8* 10.8* 9.8* 9.5*  HCT 22.4* 29.4* 33.4* 30.0* 28.6*  MCV 85.5 84.7 87.4 86.0 85.9  PLT 255 255 284 274 419   Basic Metabolic Panel: Recent Labs  Lab 11/15/17 1630 11/15/17 2152 11/17/17 0243 11/18/17 0405 11/19/17 1432 11/21/17 0546 11/21/17 0900  NA 140 136 140 140 139  --  135  K 4.0 4.0 3.3* 3.9 3.7  --  4.0  CL 114* 104 107 105 105  --  99*  CO2 14* 20* 21* 24 22  --  20*  GLUCOSE 94 125* 120* 57* 110*  --  123*  BUN 85* 37* 51* 29* 51*  --  44*  CREATININE 10.56* 5.79* 7.13* 5.10* 6.80* 6.02* 6.06*  CALCIUM 6.3* 7.2* 6.5* 7.4* 6.8*  --  6.7*  MG  --  2.1  --   --   --   --   --   PHOS 5.3*  --   --   --  6.1*  --  6.3*   GFR: Estimated Creatinine Clearance: 7.9 mL/min (A) (by C-G formula based on SCr of 6.06 mg/dL (H)). Liver Function Tests: Recent Labs  Lab 11/15/17 1630 11/15/17 2152 11/19/17 1432 11/21/17 0900  AST  --  40  --   --   ALT  --  34  --   --   ALKPHOS  --  243*  --   --   BILITOT  --  1.7*  --   --   PROT  --  6.2*  --   --   ALBUMIN 2.8* 2.6* 2.9* 2.8*   No results for input(s): LIPASE, AMYLASE in the last 168 hours. No results for input(s): AMMONIA in the last 168 hours. Coagulation Profile: Recent Labs  Lab 11/14/17 1743  INR 1.18   Cardiac Enzymes: No results for input(s): CKTOTAL, CKMB, CKMBINDEX, TROPONINI in the last 168 hours. BNP (last 3 results) No results for input(s): PROBNP in the last 8760 hours. HbA1C: No results for input(s): HGBA1C in the last 72 hours. CBG: Recent Labs  Lab 11/20/17 1230 11/20/17 1303 11/20/17 1737 11/20/17 2126 11/21/17 0750  GLUCAP 172* 161* 223* 165* 126*   Lipid Profile: No results for input(s): CHOL, HDL, LDLCALC, TRIG, CHOLHDL, LDLDIRECT in the last 72 hours. Thyroid Function Tests: No results for input(s): TSH, T4TOTAL, FREET4, T3FREE, THYROIDAB in the last 72 hours. Anemia Panel: No results for input(s): VITAMINB12, FOLATE,  FERRITIN, TIBC, IRON, RETICCTPCT in the last 72 hours.    Radiology Studies: I have reviewed all of the imaging during this hospital visit personally     Scheduled Meds: . atorvastatin  40 mg Oral QPM  . enoxaparin (LOVENOX) injection  30 mg Subcutaneous QHS  . fluticasone  1 spray Each Nare Daily  . insulin aspart  0-9 Units Subcutaneous TID WC  . metoprolol tartrate  25 mg Oral BID  . sodium chloride flush  3 mL Intravenous Q12H   Continuous Infusions: . sodium chloride    .  ferric gluconate (FERRLECIT/NULECIT) IV 125 mg (11/21/17 1230)     LOS: 7 days        Mauricio Gerome Apley, MD [facility] Pager [phone]

## 2017-11-21 NOTE — Progress Notes (Signed)
Assessment/Plan: 1. ESRD, new start, planning to go back to [place] for dialysis at discharge 2. PNA - improving. 3. pSVT post HD 4 Anemia with iron defic- hgb fairly stable   5 Atrophic right kidney 6. HTN/vol- BP a little high- on metoprolol- no change for now- comes down with HD and could probably use more UF  Plan HD tomorrow- 4th treatment- pre labs ,  VVS placed AVG 12/4 .          Patient returning to [place] on 12/9 via airplane.  They have arranged f/u dialysis there and have been in contact with a nephrologist.  she probably will need to stay to get HD here on Friday before discharge.  She then tells me she wont see MD til next Wednesday - not ideal but is what we have   Subjective: Interval History: Seen on HD- BP low  Objective: Vital signs in last 24 hours: Temp:  [98 F (36.7 C)-99.6 F (37.6 C)] 98.1 F (36.7 C) (12/05 0900) Pulse Rate:  [58-80] 65 (12/05 1200) Resp:  [16-22] 18 (12/05 0900) BP: (91-173)/(47-79) 121/60 (12/05 1200) SpO2:  [92 %-99 %] 94 % (12/05 0900) Weight:  [54.7 kg (120 lb 9.5 oz)] 54.7 kg (120 lb 9.5 oz) (12/05 0900) Weight change:   Intake/Output from previous day: 12/04 0701 - 12/05 0700 In: 680 [P.O.:330; I.V.:300; IV Piggyback:50] Out: 375 [Urine:350; Blood:25] Intake/Output this shift: No intake/output data recorded.  General appearance: alert and cooperative Back: negative, symmetric, no curvature. ROM normal. No CVA tenderness. Resp: clear to auscultation bilaterally Chest wall: no tenderness Cardio: regular rate and rhythm, S1, S2 normal, no murmur, click, rub or gallop Extremities: extremities normal, atraumatic, no cyanosis or edema- new left upper arm AVG wth bruit.  Right sided PC   Lab Results: Recent Labs    11/19/17 1432 11/21/17 0900  WBC 9.1 11.4*  HGB 9.8* 9.5*  HCT 30.0* 28.6*  PLT 274 261   BMET:  Recent Labs    11/19/17 1432 11/21/17 0546 11/21/17 0900  NA 139  --  135  K 3.7  --  4.0  CL 105  --  99*   CO2 22  --  20*  GLUCOSE 110*  --  123*  BUN 51*  --  44*  CREATININE 6.80* 6.02* 6.06*  CALCIUM 6.8*  --  6.7*   No results for input(s): PTH in the last 72 hours. Iron Studies: No results for input(s): IRON, TIBC, TRANSFERRIN, FERRITIN in the last 72 hours. Studies/Results: No results found.  Scheduled: . atorvastatin  40 mg Oral QPM  . enoxaparin (LOVENOX) injection  30 mg Subcutaneous QHS  . fluticasone  1 spray Each Nare Daily  . insulin aspart  0-9 Units Subcutaneous TID WC  . metoprolol tartrate  25 mg Oral BID  . sodium chloride flush  3 mL Intravenous Q12H     LOS: 7 days   Vicktoria Muckey A 11/21/2017,12:28 PM

## 2017-11-21 NOTE — Procedures (Signed)
Patient was seen on dialysis and the procedure was supervised.  BFR 400  Via PC BP is  121/60.   Patient appears to be tolerating treatment well- goal relatively small at [place] 11/21/2017

## 2017-11-21 NOTE — Progress Notes (Signed)
  PT Cancellation Note  Patient Details Name: [name] MRN: [id number] DOB: 08-Feb-1961   Cancelled Treatment:    Reason Eval/Treat Not Completed: (P) Patient at procedure or test/unavailable Pt at HD this afternoon. PT will follow up with treatment tomorrow.     [place] 11/21/2017, 5:27 PM

## 2017-11-21 NOTE — Progress Notes (Signed)
  Progress Note    11/21/2017 8:50 AM 1 Day Post-Op  Subjective:  C/o mild left arm/shoulder pain  Vitals:   11/20/17 2133 11/21/17 0514  BP: (!) 163/64 (!) 173/68  Pulse: 73 67  Resp: 16 16  Temp: 98.8 F (37.1 C) 98.1 F (36.7 C)  SpO2: 95% 94%    Physical Exam: Awake and alert Non labored breathing Strong thrill in left upper arm avg Left hand is warm  CBC    Component Value Date/Time   WBC 9.1 11/19/2017 1432   RBC 3.49 (L) 11/19/2017 1432   HGB 9.8 (L) 11/19/2017 1432   HGB 10.8 (L) 08/11/2016 1105   HCT 30.0 (L) 11/19/2017 1432   HCT 33.2 (L) 08/11/2016 1105   PLT 274 11/19/2017 1432   PLT 274 08/11/2016 1105   MCV 86.0 11/19/2017 1432   MCV 89 08/11/2016 1105   MCH 28.1 11/19/2017 1432   MCHC 32.7 11/19/2017 1432   RDW 14.8 11/19/2017 1432   RDW 14.5 08/11/2016 1105   LYMPHSABS 2.0 08/11/2016 1105   MONOABS 0.7 10/13/2011 1330   EOSABS 0.2 08/11/2016 1105   BASOSABS 0.1 08/11/2016 1105    BMET    Component Value Date/Time   NA 139 11/19/2017 1432   NA 145 (H) 11/05/2017 0908   K 3.7 11/19/2017 1432   CL 105 11/19/2017 1432   CO2 22 11/19/2017 1432   GLUCOSE 110 (H) 11/19/2017 1432   BUN 51 (H) 11/19/2017 1432   BUN 84 (HH) 11/05/2017 0908   CREATININE 6.02 (H) 11/21/2017 0546   CALCIUM 6.8 (L) 11/19/2017 1432   GFRNONAA 7 (L) 11/21/2017 0546   GFRAA 8 (L) 11/21/2017 0546    INR    Component Value Date/Time   INR 1.18 11/14/2017 1743     Intake/Output Summary (Last 24 hours) at 11/21/2017 0850 Last data filed at 11/21/2017 0523 Gross per 24 hour  Intake 680 ml  Output 75 ml  Net 605 ml     Assessment:  56 y.o. female is s/p placement of left upper arm avg, plan to get hd in [place]  Plan: Ok to use in 2 weeks Can f/u prn   Carel Carrier C. Donzetta Matters, MD [facility] Office: [phone] Pager: [phone]  11/21/2017 8:50 AM

## 2017-11-22 LAB — GLUCOSE, CAPILLARY
GLUCOSE-CAPILLARY: 72 mg/dL (ref 65–99)
GLUCOSE-CAPILLARY: 116 mg/dL — AB (ref 65–99)
GLUCOSE-CAPILLARY: 185 mg/dL — AB (ref 65–99)
Glucose-Capillary: 162 mg/dL — ABNORMAL HIGH (ref 65–99)

## 2017-11-22 LAB — BASIC METABOLIC PANEL
Anion gap: 11 (ref 5–15)
BUN: 23 mg/dL — AB (ref 6–20)
CALCIUM: 7.7 mg/dL — AB (ref 8.9–10.3)
CO2: 25 mmol/L (ref 22–32)
CREATININE: 3.99 mg/dL — AB (ref 0.44–1.00)
Chloride: 97 mmol/L — ABNORMAL LOW (ref 101–111)
GFR calc Af Amer: 14 mL/min — ABNORMAL LOW (ref >60)
GFR calc non Af Amer: 12 mL/min — ABNORMAL LOW (ref >60)
GLUCOSE: 79 mg/dL (ref 65–99)
Potassium: 4.4 mmol/L (ref 3.5–5.1)
Sodium: 133 mmol/L — ABNORMAL LOW (ref 135–145)

## 2017-11-22 MED ORDER — CALCIUM CARBONATE ANTACID 500 MG PO CHEW
200.0000 mg | CHEWABLE_TABLET | Freq: Three times a day (TID) | ORAL | Status: DC
  Administered 2017-11-22 – 2017-11-23 (×3): 200 mg via ORAL
  Filled 2017-11-22 (×3): qty 1

## 2017-11-22 MED ORDER — DARBEPOETIN ALFA 100 MCG/0.5ML IJ SOSY
100.0000 ug | PREFILLED_SYRINGE | INTRAMUSCULAR | Status: DC
  Administered 2017-11-23: 100 ug via INTRAVENOUS
  Filled 2017-11-22: qty 0.5

## 2017-11-22 NOTE — Progress Notes (Signed)
PROGRESS NOTE    Marielena Valoree Agent  ZOX:[id number] DOB: 1961/12/17 DOA: 11/14/2017 PCP: Mack Hook, MD    Brief Narrative:  56 year old female who presented with dyspnea. Patient does have significant past med history of type 2 diabetes mellitus, hypertension and chronic kidney disease. Her dyspnea was associated with back pain chest pain. On the initial physical examination her blood pressure was 149/69, heart rate 98, respiratory 24, oxygen saturation 95%. Her lungs had rested right side, no wheezing or rhonchi, heart S1-S2 present, rhythmic, tachycardic, abdomen was soft nontender, no lower extremity edema. Sodium 140, potassium 4.5, chloride 114, bicarbonate 13, glucose 58, BUN 80, creatinine 10.5, white cell count 14.4, hemoglobin 6.7, hematocrit 21.2, platelets 290, chest x-ray with right upper lobe infiltrate, right lower lobe infiltrate and left upper lobe infiltrate, confirmed by CT chest which also showed a right pleural effusion. EKG sinus rhythm, rate of 100 bpm, normal axis, normal intervals.  Patient was admitted to the hospital working diagnosis of multi lobar pneumonia, in the setting of chronic kidney disease.   Assessment & Plan:   Principal Problem:   Multifocal pneumonia Active Problems:   Diabetes mellitus with hemoglobin A1c goal of 7.0%-8.0% (HCC)   Essential hypertension   CKD (chronic kidney disease) stage 5, GFR less than 15 ml/min (HCC)   Metabolic acidosis   Anemia   PNA (pneumonia)   Paroxysmal tachycardia (HCC)  1. Multilobar pneumonia. Patient has completed antibiotic therapy, clinically improved, no dyspnea, cough or fevers.   2. Progressive chronic kidney disease stage V,temporary access on the right IJ, and left upper extremity AVG. Clinically euvolemic, serum k at 4,4 and serum bicarbonate at 25, plan for HD in am, then next HD on Tuesday 12/11.  3. Type 2 diabetes mellitus. Glucose cover and monitoring with insulin sliding scale,  capillary glucose 83, 88, 112, 72, 116. Patient tolerating po well.   4. Hypertension. Continue bid metoprolol with good blood pressure control, systolic 811 mmHg.   5. Anemia of chronic disease. Continue with IV iron on HD.   DVT prophylaxis: heparin  Code Status:  full Family Communication: No family at the bedside Disposition Plan: home   Consultants:   Nephrology   Procedures:   AVG   HD catheter  Antimicrobials:   Cefazolin IV    Subjective: Patient feeling better, positive pain on the left upper extremity, controlled with analgesics, no nausea or vomiting, no fever or chills.   Objective: Vitals:   11/21/17 1310 11/21/17 1420 11/21/17 2215 11/22/17 0507  BP: (!) 120/55 (!) 131/48 (!) 136/58 133/66  Pulse: 66 68 68 60  Resp: 18 16 12 16   Temp: 97.9 F (36.6 C) 98 F (36.7 C) 98.7 F (37.1 C) 98.3 F (36.8 C)  TempSrc: Oral     SpO2: 95%  93% 97%  Weight: 53.7 kg (118 lb 6.2 oz)     Height:        Intake/Output Summary (Last 24 hours) at 11/22/2017 1235 Last data filed at 11/22/2017 9147 Gross per 24 hour  Intake 13 ml  Output 922 ml  Net -909 ml   Filed Weights   11/19/17 1800 11/21/17 0900 11/21/17 1310  Weight: 56.4 kg (124 lb 5.4 oz) 54.7 kg (120 lb 9.5 oz) 53.7 kg (118 lb 6.2 oz)    Examination:   General: Not in pain or dyspnea, deconditioned Neurology: Awake and alert, non focal  E ENT: mild pallor, no icterus, oral mucosa moist Cardiovascular: No JVD. S1-S2 present, rhythmic,  no gallops, rubs, or murmurs. No lower extremity edema. Left upper extremity fistula in place.  Pulmonary: vesicular breath sounds bilaterally, adequate air movement, no wheezing, rhonchi or rales. Gastrointestinal. Abdomen flat, no organomegaly, non tender, no rebound or guarding Skin. No rashes Musculoskeletal: no joint deformities     Data Reviewed: I have personally reviewed following labs and imaging studies  CBC: Recent Labs  Lab 11/16/17 0406  11/17/17 0243 11/18/17 0405 11/19/17 1432 11/21/17 0900  WBC 8.4 8.2 9.5 9.1 11.4*  HGB 7.3* 9.8* 10.8* 9.8* 9.5*  HCT 22.4* 29.4* 33.4* 30.0* 28.6*  MCV 85.5 84.7 87.4 86.0 85.9  PLT 255 255 284 274 703   Basic Metabolic Panel: Recent Labs  Lab 11/15/17 1630 11/15/17 2152 11/17/17 0243 11/18/17 0405 11/19/17 1432 11/21/17 0546 11/21/17 0900 11/22/17 0455  NA 140 136 140 140 139  --  135 133*  K 4.0 4.0 3.3* 3.9 3.7  --  4.0 4.4  CL 114* 104 107 105 105  --  99* 97*  CO2 14* 20* 21* 24 22  --  20* 25  GLUCOSE 94 125* 120* 57* 110*  --  123* 79  BUN 85* 37* 51* 29* 51*  --  44* 23*  CREATININE 10.56* 5.79* 7.13* 5.10* 6.80* 6.02* 6.06* 3.99*  CALCIUM 6.3* 7.2* 6.5* 7.4* 6.8*  --  6.7* 7.7*  MG  --  2.1  --   --   --   --   --   --   PHOS 5.3*  --   --   --  6.1*  --  6.3*  --    GFR: Estimated Creatinine Clearance: 12 mL/min (A) (by C-G formula based on SCr of 3.99 mg/dL (H)). Liver Function Tests: Recent Labs  Lab 11/15/17 1630 11/15/17 2152 11/19/17 1432 11/21/17 0900  AST  --  40  --   --   ALT  --  34  --   --   ALKPHOS  --  243*  --   --   BILITOT  --  1.7*  --   --   PROT  --  6.2*  --   --   ALBUMIN 2.8* 2.6* 2.9* 2.8*   No results for input(s): LIPASE, AMYLASE in the last 168 hours. No results for input(s): AMMONIA in the last 168 hours. Coagulation Profile: No results for input(s): INR, PROTIME in the last 168 hours. Cardiac Enzymes: No results for input(s): CKTOTAL, CKMB, CKMBINDEX, TROPONINI in the last 168 hours. BNP (last 3 results) No results for input(s): PROBNP in the last 8760 hours. HbA1C: No results for input(s): HGBA1C in the last 72 hours. CBG: Recent Labs  Lab 11/21/17 1457 11/21/17 1703 11/21/17 2218 11/22/17 0753 11/22/17 1158  GLUCAP 83 88 112* 72 116*   Lipid Profile: No results for input(s): CHOL, HDL, LDLCALC, TRIG, CHOLHDL, LDLDIRECT in the last 72 hours. Thyroid Function Tests: No results for input(s): TSH, T4TOTAL,  FREET4, T3FREE, THYROIDAB in the last 72 hours. Anemia Panel: No results for input(s): VITAMINB12, FOLATE, FERRITIN, TIBC, IRON, RETICCTPCT in the last 72 hours.    Radiology Studies: I have reviewed all of the imaging during this hospital visit personally     Scheduled Meds: . atorvastatin  40 mg Oral QPM  . calcium carbonate  200 mg of elemental calcium Oral TID WC  . [START ON 11/23/2017] darbepoetin (ARANESP) injection - DIALYSIS  100 mcg Intravenous Q Fri-HD  . enoxaparin (LOVENOX) injection  30 mg Subcutaneous QHS  .  fluticasone  1 spray Each Nare Daily  . insulin aspart  0-9 Units Subcutaneous TID WC  . metoprolol tartrate  25 mg Oral BID  . sodium chloride flush  3 mL Intravenous Q12H   Continuous Infusions: . sodium chloride    . ferric gluconate (FERRLECIT/NULECIT) IV Stopped (11/21/17 1330)     LOS: 8 days        Chavy Avera Gerome Apley, MD [facility] Pager [phone]

## 2017-11-22 NOTE — Progress Notes (Signed)
Assessment/Plan: 1. ESRD, new start, planning to go back to [place] for dialysis at discharge 2. PNA - improving. 3. pSVT post HD 4 Anemia with iron defic- giving iron hgb fairly stable - give one dose of ESA tomorrow  5 Atrophic right kidney 6. HTN/vol- BP better- on metoprolol-coming down with HD and could probably use more UF 7. Bones- phos 6.3- start TUMS with meals- told her she can get as OP without prescription  Plan HD tomorrow- 5th treatment- pre labs ,  VVS placed AVG 12/4 .          Patient returning to [place] on 12/9 via airplane.  They have arranged f/u dialysis there and have been in contact with a nephrologist.  she will need to stay to get HD here tomorrow before discharge.  She then tells me she wont see MD til next Tuesday  - not ideal but is what we have   Subjective: Interval History: Seen on HD- BP low  Objective: Vital signs in last 24 hours: Temp:  [97.9 F (36.6 C)-98.7 F (37.1 C)] 98.3 F (36.8 C) (12/06 0507) Pulse Rate:  [60-68] 60 (12/06 0507) Resp:  [12-18] 16 (12/06 0507) BP: (120-136)/(48-66) 133/66 (12/06 0507) SpO2:  [93 %-97 %] 97 % (12/06 0507) Weight:  [53.7 kg (118 lb 6.2 oz)] 53.7 kg (118 lb 6.2 oz) (12/05 1310) Weight change:   Intake/Output from previous day: 12/05 0701 - 12/06 0700 In: 10 [I.V.:10] Out: 922 [Urine:200] Intake/Output this shift: Total I/O In: 3 [I.V.:3] Out: -   General appearance: alert and cooperative Back: negative, symmetric, no curvature. ROM normal. No CVA tenderness. Resp: clear to auscultation bilaterally Chest wall: no tenderness Cardio: regular rate and rhythm, S1, S2 normal, no murmur, click, rub or gallop Extremities: extremities normal, atraumatic, no cyanosis or edema- new left upper arm AVG wth bruit.  Right sided PC   Lab Results: Recent Labs    11/19/17 1432 11/21/17 0900  WBC 9.1 11.4*  HGB 9.8* 9.5*  HCT 30.0* 28.6*  PLT 274 261   BMET:  Recent Labs    11/21/17 0900 11/22/17 0455   NA 135 133*  K 4.0 4.4  CL 99* 97*  CO2 20* 25  GLUCOSE 123* 79  BUN 44* 23*  CREATININE 6.06* 3.99*  CALCIUM 6.7* 7.7*   No results for input(s): PTH in the last 72 hours. Iron Studies: No results for input(s): IRON, TIBC, TRANSFERRIN, FERRITIN in the last 72 hours. Studies/Results: No results found.  Scheduled: . atorvastatin  40 mg Oral QPM  . enoxaparin (LOVENOX) injection  30 mg Subcutaneous QHS  . fluticasone  1 spray Each Nare Daily  . insulin aspart  0-9 Units Subcutaneous TID WC  . metoprolol tartrate  25 mg Oral BID  . sodium chloride flush  3 mL Intravenous Q12H     LOS: 8 days   Anuja Manka A 11/22/2017,11:35 AM

## 2017-11-22 NOTE — Progress Notes (Signed)
Physical Therapy Treatment Patient Details Name: [name] MRN: [id number] DOB: [birth year] Today's Date: 11/22/2017    History of Present Illness [name] is a 56 y.o. female with long hx of DM 2 > 20 yrs, HTN and CKD f/b CKA.  She presents to ED w/ complaint of pain in her back and chest, also SOB.  CXR showed multifocal pulmonary infiltrates and cardiomegaly without pulmonary edema.  CT chest showed multifocal dense infiltrates R > L w/ R sided pleural effusion. Creat here is 10.5. Received HD 11/19/17    PT Comments    Pt is making good progress towards her goals. Pt is currently min guard for bed mobility, minA for transfers and min guard for ambulation of 300 feet with RW. Pt states she is feeling much better and is able to walk further because she is not getting SoB anymore. PT will continue to follow pt acutely to increase strength for travel to [place] at D/C.    Follow Up Recommendations  No PT follow up     Equipment Recommendations  None recommended by PT    Recommendations for Other Services       Precautions / Restrictions Precautions Precautions: Other (comment)(legally blind) Restrictions Weight Bearing Restrictions: No    Mobility  Bed Mobility Overal bed mobility: Needs Assistance Bed Mobility: Supine to Sit     Supine to sit: HOB elevated;Min guard     General bed mobility comments: min guard for safety, tactile cuing for hand placement to pull to EoB  Transfers Overall transfer level: Needs assistance Equipment used: Rolling walker (2 wheeled) Transfers: Sit to/from Stand Sit to Stand: Min assist         General transfer comment: minA for guiding hands to RW, good power up and steadying in standing    Ambulation/Gait Ambulation/Gait assistance: Min guard Ambulation Distance (Feet): 300 Feet Assistive device: Rolling walker (2 wheeled) Gait Pattern/deviations: Step-through pattern;Decreased step length -  right;Decreased step length - left;Decreased weight shift to left;Narrow base of support;Drifts right/left Gait velocity: slowed Gait velocity interpretation: Below normal speed for age/gender General Gait Details: minA for guiding RW in hallway, steady gait with verbal and tactile cues to guide RW due to decreased vision. weight shift is equal today     Balance Overall balance assessment: Needs assistance Sitting-balance support: Feet unsupported;No upper extremity supported Sitting balance-Leahy Scale: Good     Standing balance support: Bilateral upper extremity supported Standing balance-Leahy Scale: Good                              Cognition Arousal/Alertness: Awake/alert Behavior During Therapy: WFL for tasks assessed/performed Overall Cognitive Status: Within Functional Limits for tasks assessed                                           General Comments General comments (skin integrity, edema, etc.): Pt daughter and grandchildren present during session, Utilized Str[employer] interpreter [name]      Pertinent Vitals/Pain Pain Assessment: No/denies pain    Home Living Family/patient expects to be discharged to:: Private residence Living Arrangements: Spouse/significant other Available Help at Discharge: Family;Available PRN/intermittently Type of Home: House Home Access: Stairs to enter   Home Layout: One level Home Equipment: Kasandra Knudsen - single point      Prior Function  Level of Independence: Independent with assistive device(s);Needs assistance  Gait / Transfers Assistance Needed: independent in her home with [facility], requires assist due to limited vision in unfamiliar places ADL's / Homemaking Assistance Needed: independant     PT Goals (current goals can now be found in the care plan section) Acute Rehab PT Goals Patient Stated Goal: go to [place] PT Goal Formulation: With patient Time For Goal Achievement: 12/03/17 Potential to  Achieve Goals: Good Progress towards PT goals: Progressing toward goals    Frequency    Min 3X/week      PT Plan Current plan remains appropriate    Co-evaluation PT/OT/SLP Co-Evaluation/Treatment: Yes            AM-PAC PT "6 Clicks" Daily Activity  Outcome Measure  Difficulty turning over in bed (including adjusting bedclothes, sheets and blankets)?: A Little Difficulty moving from lying on back to sitting on the side of the bed? : A Little Difficulty sitting down on and standing up from a chair with arms (e.g., wheelchair, bedside commode, etc,.)?: Unable Help needed moving to and from a bed to chair (including a wheelchair)?: A Little Help needed walking in hospital room?: A Little Help needed climbing 3-5 steps with a railing? : A Little 6 Click Score: 16    End of Session Equipment Utilized During Treatment: Gait belt Activity Tolerance: Patient tolerated treatment well Patient left: in chair;with call bell/phone within reach;with family/visitor present Nurse Communication: Mobility status;Other (comment)(supplemental O2 removal) PT Visit Diagnosis: Unsteadiness on feet (R26.81);Other abnormalities of gait and mobility (R26.89);Muscle weakness (generalized) (M62.81);Difficulty in walking, not elsewhere classified (R26.2)     Time: 6384-6659 PT Time Calculation (min) (ACUTE ONLY): 23 min  Charges:  $Gait Training: 23-37 mins                    G Codes:       Zitlaly Malson B. Migdalia Dk PT, DPT Acute Rehabilitation  [phone] Pager [phone]  [place] 11/22/2017, 2:42 PM

## 2017-11-23 DIAGNOSIS — J189 Pneumonia, unspecified organism: Principal | ICD-10-CM

## 2017-11-23 LAB — RENAL FUNCTION PANEL
Albumin: 2.9 g/dL — ABNORMAL LOW (ref 3.5–5.0)
Anion gap: 16 — ABNORMAL HIGH (ref 5–15)
BUN: 47 mg/dL — AB (ref 6–20)
CHLORIDE: 92 mmol/L — AB (ref 101–111)
CO2: 21 mmol/L — AB (ref 22–32)
CREATININE: 6.49 mg/dL — AB (ref 0.44–1.00)
Calcium: 7.4 mg/dL — ABNORMAL LOW (ref 8.9–10.3)
GFR calc Af Amer: 8 mL/min — ABNORMAL LOW (ref >60)
GFR, EST NON AFRICAN AMERICAN: 6 mL/min — AB (ref >60)
GLUCOSE: 115 mg/dL — AB (ref 65–99)
POTASSIUM: 4.1 mmol/L (ref 3.5–5.1)
Phosphorus: 5.8 mg/dL — ABNORMAL HIGH (ref 2.5–4.6)
Sodium: 129 mmol/L — ABNORMAL LOW (ref 135–145)

## 2017-11-23 LAB — CBC
HEMATOCRIT: 28.7 % — AB (ref 36.0–46.0)
Hemoglobin: 9.6 g/dL — ABNORMAL LOW (ref 12.0–15.0)
MCH: 28.5 pg (ref 26.0–34.0)
MCHC: 33.4 g/dL (ref 30.0–36.0)
MCV: 85.2 fL (ref 78.0–100.0)
PLATELETS: 247 10*3/uL (ref 150–400)
RBC: 3.37 MIL/uL — ABNORMAL LOW (ref 3.87–5.11)
RDW: 14.1 % (ref 11.5–15.5)
WBC: 8.4 10*3/uL (ref 4.0–10.5)

## 2017-11-23 LAB — GLUCOSE, CAPILLARY
Glucose-Capillary: 122 mg/dL — ABNORMAL HIGH (ref 65–99)
Glucose-Capillary: 115 mg/dL — ABNORMAL HIGH (ref 65–99)

## 2017-11-23 MED ORDER — CALCIUM CARBONATE ANTACID 500 MG PO CHEW: 200.0000 mg | CHEWABLE_TABLET | Freq: Three times a day (TID) | ORAL | 0 refills | Status: AC

## 2017-11-23 MED ORDER — DARBEPOETIN ALFA 100 MCG/0.5ML IJ SOSY
PREFILLED_SYRINGE | INTRAMUSCULAR | Status: AC
Start: 2017-11-23 — End: 2017-11-23
  Administered 2017-11-23: 100 ug via INTRAVENOUS
  Filled 2017-11-23: qty 0.5

## 2017-11-23 MED ORDER — HEPARIN SODIUM (PORCINE) 1000 UNIT/ML DIALYSIS
20.0000 [IU]/kg | INTRAMUSCULAR | Status: DC | PRN
  Filled 2017-11-23: qty 2

## 2017-11-23 MED ORDER — ACETAMINOPHEN 500 MG PO TABS: 500.0000 mg | ORAL_TABLET | Freq: Four times a day (QID) | ORAL | 0 refills | Status: AC | PRN

## 2017-11-23 MED ORDER — METOPROLOL TARTRATE 25 MG PO TABS: 25.0000 mg | ORAL_TABLET | Freq: Two times a day (BID) | ORAL | 0 refills | Status: AC

## 2017-11-23 NOTE — Progress Notes (Signed)
Report given to [facility] in Dialysis.

## 2017-11-23 NOTE — Procedures (Signed)
Patient was seen on dialysis and the procedure was supervised.  BFR 400  Via PC BP is  108/50.   Patient appears to be tolerating treatment well  Ajeenah Heiny A 11/23/2017

## 2017-11-23 NOTE — Progress Notes (Signed)
Discharge order in, getting patient ready for dc, in house translator called and will be up as soon as possible.

## 2017-11-23 NOTE — Progress Notes (Signed)
Physical Therapy Treatment Patient Details Name: [name] MRN: [id number] DOB: July 26, 1961 Today's Date: 11/23/2017    History of Present Illness [name] is a 56 y.o. female with long hx of DM 2 > 20 yrs, HTN and CKD f/b CKA.  She presents to ED w/ complaint of pain in her back and chest, also SOB.  CXR showed multifocal pulmonary infiltrates and cardiomegaly without pulmonary edema.  CT chest showed multifocal dense infiltrates R > L w/ R sided pleural effusion. Creat here is 10.5. Received HD 11/19/17    PT Comments    Pt is making good progress towards her goals and reports she is feeling much better and does not get SoB like she was. Pt still has mild unsteadiness with her gait. Pt is currently min guard for bed mobility, transfers and ambulation of 400 feet with a quad cane. Pt requires min A for safe ascent/descent of 10 steps with rail and quad cane. D/c home before her trip to [place] on Sunday.    Follow Up Recommendations  No PT follow up     Equipment Recommendations  None recommended by PT    Recommendations for Other Services       Precautions / Restrictions Precautions Precautions: Other (comment)(legally blind) Restrictions Weight Bearing Restrictions: No    Mobility  Bed Mobility Overal bed mobility: Needs Assistance Bed Mobility: Supine to Sit     Supine to sit: HOB elevated;Min guard     General bed mobility comments: min guard for safety,   Transfers Overall transfer level: Needs assistance Equipment used: None Transfers: Sit to/from Stand Sit to Stand: Min guard         General transfer comment: min guard for safety, good power up and steadying in standing   Ambulation/Gait Ambulation/Gait assistance: Min guard Ambulation Distance (Feet): 350 Feet Assistive device: Quad cane Gait Pattern/deviations: Step-through pattern;Decreased step length - right;Decreased step length - left;Decreased weight shift to left;Narrow  base of support;Drifts right/left Gait velocity: slowed Gait velocity interpretation: Below normal speed for age/gender General Gait Details: hands on min guard for guiding in hallway, vc for cane use pt reports that she is feeling much better with walking and is not as short of breath   Stairs Stairs: Yes   Stair Management: One rail Left;Forwards;Step to pattern;With cane Number of Stairs: 10 General stair comments: minA for steadying particularly with descent, vc for sequencing with cane     Balance Overall balance assessment: Needs assistance Sitting-balance support: Feet unsupported;No upper extremity supported Sitting balance-Leahy Scale: Good     Standing balance support: No upper extremity supported;During functional activity Standing balance-Leahy Scale: Good                              Cognition Arousal/Alertness: Awake/alert Behavior During Therapy: WFL for tasks assessed/performed Overall Cognitive Status: Within Functional Limits for tasks assessed                                           General Comments General comments (skin integrity, edema, etc.): Str[employer] interpreter Izora Gala # [phone]. Pt son present through out session and aided with giving mother directions      Pertinent Vitals/Pain Pain Assessment: Faces Faces Pain Scale: Hurts little more Pain Location: L knee coming down stairs Pain Descriptors / Indicators: Aching;Discomfort  Pain Intervention(s): Monitored during session           PT Goals (current goals can now be found in the care plan section) Acute Rehab PT Goals Patient Stated Goal: go to [place] PT Goal Formulation: With patient Time For Goal Achievement: 12/03/17 Potential to Achieve Goals: Good Progress towards PT goals: Progressing toward goals    Frequency    Min 3X/week      PT Plan Current plan remains appropriate    Co-evaluation PT/OT/SLP Co-Evaluation/Treatment: Yes             AM-PAC PT "6 Clicks" Daily Activity  Outcome Measure  Difficulty turning over in bed (including adjusting bedclothes, sheets and blankets)?: A Little Difficulty moving from lying on back to sitting on the side of the bed? : A Little Difficulty sitting down on and standing up from a chair with arms (e.g., wheelchair, bedside commode, etc,.)?: A Little Help needed moving to and from a bed to chair (including a wheelchair)?: A Little Help needed walking in hospital room?: A Little Help needed climbing 3-5 steps with a railing? : A Little 6 Click Score: 18    End of Session Equipment Utilized During Treatment: Gait belt Activity Tolerance: Patient tolerated treatment well Patient left: in chair;with call bell/phone within reach;with family/visitor present Nurse Communication: Mobility status;Other (comment)(supplemental O2 removal) PT Visit Diagnosis: Unsteadiness on feet (R26.81);Other abnormalities of gait and mobility (R26.89);Muscle weakness (generalized) (M62.81);Difficulty in walking, not elsewhere classified (R26.2)     Time: 6144-3154 PT Time Calculation (min) (ACUTE ONLY): 24 min  Charges:  $Gait Training: 8-22 mins                    G Codes:       Mattisen Pohlmann B. Migdalia Dk PT, DPT Acute Rehabilitation  [phone] Pager [phone]     [place] 11/23/2017, 3:46 PM

## 2017-11-23 NOTE — Progress Notes (Signed)
[name] to be D/C'd Home per MD order.  Discussed with the patient and all questions fully answered.   VSS, Skin clean, dry and intact without evidence of skin break down, no evidence of skin tears noted. IV catheter discontinued intact. Site without signs and symptoms of complications. Dressing and pressure applied.  An After Visit Summary was printed and given to the patient. Patient received prescription.  D/c education completed with patient/family including follow up instructions, medication list, d/c activities limitations if indicated, with other d/c instructions as indicated by MD - patient able to verbalize understanding, all questions fully answered.   Patient instructed to return to ED, call 911, or call MD for any changes in condition.   Patient escorted via [place], and D/C home via private auto. Allergies as of 11/23/2017      Reactions   Trazodone Anaphylaxis   Daughter doubts this, but [facility] had this entered in from 2015      Medication List    STOP taking these medications   amLODipine 5 MG tablet Commonly known as:  NORVASC   glipiZIDE 5 MG 24 hr tablet Commonly known as:  GLUCOTROL XL   metaxalone 400 MG tablet Commonly known as:  SKELAXIN     TAKE these medications   acetaminophen 500 MG tablet Commonly known as:  TYLENOL Take 1 tablet (500 mg total) by mouth every 6 (six) hours as needed for mild pain or headache. What changed:  how much to take   atorvastatin 40 MG tablet Commonly known as:  LIPITOR 1 tab by mouth with evening meal What changed:    how much to take  how to take this  when to take this  additional instructions   calcium carbonate 500 MG chewable tablet Commonly known as:  TUMS - dosed in mg elemental calcium Chew 1 tablet (200 mg of elemental calcium total) by mouth 3 (three) times daily with meals.   metoprolol tartrate 25 MG tablet Commonly known as:  LOPRESSOR Take 1 tablet (25 mg total) by mouth 2 (two) times  daily.      Discharge Instructions    Diet - low sodium heart healthy   Complete by:  As directed    Discharge instructions   Complete by:  As directed    Please follow up with nephrology on 12.11.18 for hemodialysis.   Increase activity slowly   Complete by:  As directed     Patient getting dressed with nurse tech assistance and will be taken down to car.  Son has verbalized that he has made contact with nephrology in [place] for dialysis.  [name] 11/23/2017 3:49 PM

## 2017-11-23 NOTE — Discharge Summary (Signed)
Physician Discharge Summary  [name] JSR:[id number] DOB: June 18, 1961 DOA: 11/14/2017  PCP: Mack Hook, MD  Admit date: 11/14/2017 Discharge date: 11/23/2017  Admitted From: Home Disposition:  Home  Recommendations for Outpatient Follow-up:  1. Follow up with PCP in 1- week 2. Follow up with nephrology on 11/27/17 for hemodialysis 3. Holding antihypertensive agents due to risk of hypotension 4. Holding on oral hypoglycemic agents 5. Patient has a left upper extremity arterial-venous graft not mature yet/ ok to use 12/19.  6. Current hemodialysis access: 20 cm Mahurkar Trialysis catheter at the right internal jugular vein   Home Health: No Equipment/Devices: no    Discharge Condition: stable CODE STATUS: full  Diet recommendation: Renal prudent diet.  Brief/Interim Summary: 56 year old female who presented with dyspnea. Patient does have significant past medical history of type 2 diabetes mellitus, hypertension and chronic kidney disease. Her dyspnea was associated with back pain and chest pain. On the initial physical examination her blood pressure was 149/69, heart rate 98, respiratory rate 24, oxygen saturation 95%.Her lungs had rales at the right side, no wheezing or rhonchi, heart S1-S2 present, rhythmic, tachycardic, abdomen wassoft nontender, no lower extremity edema.Sodium 140, potassium 4.5, chloride 114, bicarbonate 13, glucose 58, BUN 80, creatinine 10.5,white cell count 14.4, hemoglobin 6.7, hematocrit 21.2, platelets 290,chest x-ray with right upper lobe infiltrate, right lower lobe infiltrate and left upper lobe infiltrate, confirmed by CT chest which also showed a right pleural effusion.EKG sinus rhythm, rate of 100 bpm, normal axis, normal intervals.  Patient was admitted to the hospital with the working diagnosis of multi lobar pneumonia,in the setting of chronic kidney disease complicated by uremia, metabolic acidosis and hyperkalemia.   1.  Multilobar pneumonia/ community-acquired. Patient was admitted to the medical ward, she was placed on a remote telemetry monitor, she received IV antibiotic therapy with ceftriaxone and azithromycin, she was successfully transitioned to oral antibiotic with cefpodoxime, she completed a seven-day course during her hospital stay. Her cultures remain no growth, she has remained afebrile.   2. End-stage renal disease. Patient had progressive renal failure, she was deemed end-stage, interventional cardiology was consulted and patient underwent a hemodialysis catheter placement and received her first hemodialysis treatment  November 29. She was seen by vascular surgery and a arteriovenous graft was placed for further hemodialysis treatments, it is expected to mature December 19.   3. Type 2 diabetes mellitus. Patient was placed on insulin sliding scale for glucose coverage and monitoring, capillary remained well-controlled, oral hypoglycemic agents were held.  4. Hypertension. Blood pressure remind stable, amlodipine has been held and patient has been placed on metoprolol with good response.  5. Anemia chronic renal disease. Patient received IV iron and darbepoetin alfa during hemodialysis, her discharge hemoglobin is 9.6 with hematocrit 28.7.    Discharge Diagnoses:  Principal Problem:   Multifocal pneumonia Active Problems:   Diabetes mellitus with hemoglobin A1c goal of 7.0%-8.0% (HCC)   Essential hypertension   CKD (chronic kidney disease) stage 5, GFR less than 15 ml/min (HCC)   Metabolic acidosis   Anemia   PNA (pneumonia)   Paroxysmal tachycardia ([place])    Discharge Instructions   Allergies as of 11/23/2017      Reactions   Trazodone Anaphylaxis   Daughter doubts this, but [facility] had this entered in from 2015      Medication List    STOP taking these medications   amLODipine 5 MG tablet Commonly known as:  NORVASC   glipiZIDE 5 MG 24 hr  tablet Commonly known as:  GLUCOTROL XL    metaxalone 400 MG tablet Commonly known as:  SKELAXIN     TAKE these medications   acetaminophen 500 MG tablet Commonly known as:  TYLENOL Take 1 tablet (500 mg total) by mouth every 6 (six) hours as needed for mild pain or headache. What changed:  how much to take   atorvastatin 40 MG tablet Commonly known as:  LIPITOR 1 tab by mouth with evening meal What changed:    how much to take  how to take this  when to take this  additional instructions   calcium carbonate 500 MG chewable tablet Commonly known as:  TUMS - dosed in mg elemental calcium Chew 1 tablet (200 mg of elemental calcium total) by mouth 3 (three) times daily with meals.   metoprolol tartrate 25 MG tablet Commonly known as:  LOPRESSOR Take 1 tablet (25 mg total) by mouth 2 (two) times daily.      Follow-up Information    [facility]. Go on 11/29/2017.   Why:   Post hospital follow up scheduled for 11/29/2017 at 4:00pm with Dr. Mack Hook Contact information: [address] [place] [address] [phone]         Allergies  Allergen Reactions  . Trazodone Anaphylaxis    Daughter doubts this, but [facility] had this entered in from 2015    Consultations:  Nephrology   Vascular surgery  Interventional radiology    Procedures/Studies: Ct Abdomen Pelvis Wo Contrast  Result Date: 11/14/2017 CLINICAL DATA:  56 y/o  F; chest pain and left leg pain for 3 days. EXAM: CT CHEST, ABDOMEN AND PELVIS WITHOUT CONTRAST TECHNIQUE: Multidetector CT imaging of the chest, abdomen and pelvis was performed following the standard protocol without IV contrast. COMPARISON:  None. FINDINGS: CT CHEST FINDINGS Cardiovascular: No significant vascular findings. Normal heart size. No pericardial effusion. Normal caliber thoracic aorta. Moderate calcific atherosclerosis. Mild coronary artery calcification. Mediastinum/Nodes: No enlarged mediastinal, hilar, or axillary lymph  nodes. Thyroid gland, trachea, and esophagus demonstrate no significant findings. Lungs/Pleura: Dense consolidation within the right lower lobe and patchy foci of consolidation scattered throughout the lungs bilaterally. Moderate right and small left pleural effusions. No pneumothorax. Musculoskeletal: No chest wall mass or suspicious bone lesions identified. CT ABDOMEN PELVIS FINDINGS Hepatobiliary: No focal liver abnormality is seen. No gallstones, gallbladder wall thickening, or biliary dilatation. Pancreas: Unremarkable. No pancreatic ductal dilatation or surrounding inflammatory changes. Spleen: Normal in size without focal abnormality. Adrenals/Urinary Tract: Normal adrenal glands. Severe right kidney atrophy. No hydronephrosis. No focal left kidney lesion identified. Normal bladder. Stomach/Bowel: Stomach is within normal limits. Appendix appears normal. No evidence of bowel wall thickening, distention, or inflammatory changes. Vascular/Lymphatic: Aortic atherosclerosis. No enlarged abdominal or pelvic lymph nodes. Reproductive: Status post hysterectomy. No adnexal masses. Other: Diastases rectus and several small hernias in the umbilical region containing fat. Musculoskeletal: No acute or significant osseous findings. IMPRESSION: 1. Multifocal pneumonia with dense consolidation in the right upper lobe along the major fissure. 2. Moderate right and small left pleural effusion. 3. Mild coronary artery calcific atherosclerosis. Aortic atherosclerosis. 4. Severe right kidney atrophy. 5. Diastases rectus and several small hernias containing fat in the umbilical region. Electronically Signed   By: Kristine Garbe M.D.   On: 11/14/2017 19:28   Dg Chest 2 View  Result Date: 11/14/2017 CLINICAL DATA:  2 day hx. Sob, chest upper right chest pain radiating through to right upper posterior chest. Bilat. Arm pain.  Hx DM, HTN EXAM: CHEST  2 VIEW COMPARISON:  None. FINDINGS: Heart is mildly enlarged. No  pulmonary edema. There patchy airspace filling opacities within the right upper lobe, right lower lobe, and left upper lobe. There pleural changes bilaterally, consistent small effusions or thickening. Degenerative changes are seen in the shoulders bilaterally. There is atherosclerotic calcification of the abdominal aorta. IMPRESSION: 1. Multifocal pulmonary infiltrates. 2. Cardiomegaly without pulmonary edema. Electronically Signed   By: Nolon Nations M.D.   On: 11/14/2017 15:25   Ct Chest Wo Contrast  Result Date: 11/14/2017 CLINICAL DATA:  56 y/o  F; chest pain and left leg pain for 3 days. EXAM: CT CHEST, ABDOMEN AND PELVIS WITHOUT CONTRAST TECHNIQUE: Multidetector CT imaging of the chest, abdomen and pelvis was performed following the standard protocol without IV contrast. COMPARISON:  None. FINDINGS: CT CHEST FINDINGS Cardiovascular: No significant vascular findings. Normal heart size. No pericardial effusion. Normal caliber thoracic aorta. Moderate calcific atherosclerosis. Mild coronary artery calcification. Mediastinum/Nodes: No enlarged mediastinal, hilar, or axillary lymph nodes. Thyroid gland, trachea, and esophagus demonstrate no significant findings. Lungs/Pleura: Dense consolidation within the right lower lobe and patchy foci of consolidation scattered throughout the lungs bilaterally. Moderate right and small left pleural effusions. No pneumothorax. Musculoskeletal: No chest wall mass or suspicious bone lesions identified. CT ABDOMEN PELVIS FINDINGS Hepatobiliary: No focal liver abnormality is seen. No gallstones, gallbladder wall thickening, or biliary dilatation. Pancreas: Unremarkable. No pancreatic ductal dilatation or surrounding inflammatory changes. Spleen: Normal in size without focal abnormality. Adrenals/Urinary Tract: Normal adrenal glands. Severe right kidney atrophy. No hydronephrosis. No focal left kidney lesion identified. Normal bladder. Stomach/Bowel: Stomach is within normal  limits. Appendix appears normal. No evidence of bowel wall thickening, distention, or inflammatory changes. Vascular/Lymphatic: Aortic atherosclerosis. No enlarged abdominal or pelvic lymph nodes. Reproductive: Status post hysterectomy. No adnexal masses. Other: Diastases rectus and several small hernias in the umbilical region containing fat. Musculoskeletal: No acute or significant osseous findings. IMPRESSION: 1. Multifocal pneumonia with dense consolidation in the right upper lobe along the major fissure. 2. Moderate right and small left pleural effusion. 3. Mild coronary artery calcific atherosclerosis. Aortic atherosclerosis. 4. Severe right kidney atrophy. 5. Diastases rectus and several small hernias containing fat in the umbilical region. Electronically Signed   By: Kristine Garbe M.D.   On: 11/14/2017 19:28   Ir Fluoro Guide Cv Line Right  Result Date: 11/15/2017 INDICATION: 56 year old female with end-stage renal disease and active pneumonia. She requires a temporary hemodialysis catheter for dialysis. This catheter will then be converted to a tunneled hemodialysis catheter following resolution of her acute infection. EXAM: TUNNELED CENTRAL VENOUS HEMODIALYSIS CATHETER PLACEMENT WITH ULTRASOUND AND FLUOROSCOPIC GUIDANCE MEDICATIONS: None ANESTHESIA/SEDATION: None FLUOROSCOPY TIME:  Fluoroscopy Time: 0 minutes 18 seconds (5 mGy). COMPLICATIONS: None immediate. PROCEDURE: Informed written consent was obtained from the patient after a discussion of the risks, benefits, and alternatives to treatment. Questions regarding the procedure were encouraged and answered. The right neck and chest were prepped with chlorhexidine in a sterile fashion, and a sterile drape was applied covering the operative field. Maximum barrier sterile technique with sterile gowns and gloves were used for the procedure. A timeout was performed prior to the initiation of the procedure. After creating a small venotomy  incision, a micropuncture kit was utilized to access the right internal jugular vein under direct, real-time ultrasound guidance after the overlying soft tissues were anesthetized with 1% lidocaine with epinephrine. Ultrasound image documentation was performed. The microwire was kinked to measure appropriate  catheter length. A J wire was advanced to the level of the IVC and the micropuncture sheath was exchanged for a peel-away sheath. A 20 cm Mahurkar hemodialysis catheter was then placed through the peel-away sheath with tips ultimately positioned within the superior aspect of the right atrium. Final catheter positioning was confirmed and documented with a spot radiographic image. The catheter aspirates and flushes normally. The catheter was flushed with appropriate volume heparin dwells. The catheter exit site was secured with a 0-Prolene retention suture. The patient tolerated the procedure well without immediate post procedural complication. IMPRESSION: Successful placement of 20 cm Mahurkar Trialysis catheter via the right internal jugular vein with tips terminating within the superior aspect of the right atrium. The catheter is ready for immediate use. Electronically Signed   By: Jacqulynn Cadet M.D.   On: 11/15/2017 14:20   Ir US Guide Vasc Access Right  Result Date: 11/15/2017 INDICATION: 56 year old female with end-stage renal disease and active pneumonia. She requires a temporary hemodialysis catheter for dialysis. This catheter will then be converted to a tunneled hemodialysis catheter following resolution of her acute infection. EXAM: TUNNELED CENTRAL VENOUS HEMODIALYSIS CATHETER PLACEMENT WITH ULTRASOUND AND FLUOROSCOPIC GUIDANCE MEDICATIONS: None ANESTHESIA/SEDATION: None FLUOROSCOPY TIME:  Fluoroscopy Time: 0 minutes 18 seconds (5 mGy). COMPLICATIONS: None immediate. PROCEDURE: Informed written consent was obtained from the patient after a discussion of the risks, benefits, and alternatives to  treatment. Questions regarding the procedure were encouraged and answered. The right neck and chest were prepped with chlorhexidine in a sterile fashion, and a sterile drape was applied covering the operative field. Maximum barrier sterile technique with sterile gowns and gloves were used for the procedure. A timeout was performed prior to the initiation of the procedure. After creating a small venotomy incision, a micropuncture kit was utilized to access the right internal jugular vein under direct, real-time ultrasound guidance after the overlying soft tissues were anesthetized with 1% lidocaine with epinephrine. Ultrasound image documentation was performed. The microwire was kinked to measure appropriate catheter length. A J wire was advanced to the level of the IVC and the micropuncture sheath was exchanged for a peel-away sheath. A 20 cm Mahurkar hemodialysis catheter was then placed through the peel-away sheath with tips ultimately positioned within the superior aspect of the right atrium. Final catheter positioning was confirmed and documented with a spot radiographic image. The catheter aspirates and flushes normally. The catheter was flushed with appropriate volume heparin dwells. The catheter exit site was secured with a 0-Prolene retention suture. The patient tolerated the procedure well without immediate post procedural complication. IMPRESSION: Successful placement of 20 cm Mahurkar Trialysis catheter via the right internal jugular vein with tips terminating within the superior aspect of the right atrium. The catheter is ready for immediate use. Electronically Signed   By: Jacqulynn Cadet M.D.   On: 11/15/2017 14:20   Dg Chest Port 1 View  Result Date: 11/17/2017 CLINICAL DATA:  Pneumonia. Congestive heart failure. Chronic kidney disease stage 5. EXAM: PORTABLE CHEST 1 VIEW COMPARISON:  11/14/2017 FINDINGS: Heart size remains stable. New left jugular dual-lumen central venous catheter is seen,  with tip overlying the mid right atrium. No pneumothorax visualized. The previously seen areas of airspace disease in both upper lobes and right lung base have decreased since previous study. No evidence of pneumothorax or pleural effusion. IMPRESSION: Decreased bilateral upper lobe and right basilar airspace opacity, consistent with resolving pneumonia. Electronically Signed   By: Earle Gell M.D.   On: 11/17/2017 14:55  Subjective: Patient feeling well, no nausea or vomiting, no dyspnea or chest pain. Tolerated well hemodialysis.   Discharge Exam: Vitals:   11/23/17 0830 11/23/17 0900  BP: (!) 123/59 (!) 118/44  Pulse: 60 (!) 35  Resp: 18 19  Temp:    SpO2:  96%   Vitals:   11/23/17 0805 11/23/17 0816 11/23/17 0830 11/23/17 0900  BP: (!) 142/70 138/67 (!) 123/59 (!) 118/44  Pulse: 60 60 60 (!) 35  Resp: _0 Temp: 98 F (36.7 C)     TempSrc: Oral     SpO2: 96%   96%  Weight: 54.3 kg (119 lb 11.4 oz)     Height:        General: Pt is alert, awake, not in acute distress E ENT: mild pallor, no icterus Cardiovascular: RRR, S1/S2 +, no rubs, no gallops Respiratory: CTA bilaterally, no wheezing, no rhonchi Abdominal: Soft, NT, ND, bowel sounds + Extremities: no edema, no cyanosis    The results of significant diagnostics from this hospitalization (including imaging, microbiology, ancillary and laboratory) are listed below for reference.     Microbiology: Recent Results (from the past 240 hour(s))  Culture, blood (routine x 2)     Status: None   Collection Time: 11/14/17  5:53 PM  Result Value Ref Range Status   Specimen Description BLOOD LEFT HAND  Final   Special Requests   Final    Blood Culture results may not be optimal due to an inadequate volume of blood received in culture bottles BOTTLES DRAWN AEROBIC AND ANAEROBIC   Culture NO GROWTH 5 DAYS  Final   Report Status 11/19/2017 FINAL  Final  Culture, blood (routine x 2)     Status: None   Collection  Time: 11/14/17  6:37 PM  Result Value Ref Range Status   Specimen Description BLOOD RIGHT ANTECUBITAL  Final   Special Requests   Final    Blood Culture adequate volume BOTTLES DRAWN AEROBIC AND ANAEROBIC   Culture NO GROWTH 5 DAYS  Final   Report Status 11/19/2017 FINAL  Final  Urine culture     Status: Abnormal   Collection Time: 11/16/17  3:02 AM  Result Value Ref Range Status   Specimen Description URINE, RANDOM  Final   Special Requests NONE  Final   Culture MULTIPLE SPECIES PRESENT, SUGGEST RECOLLECTION (A)  Final   Report Status 11/17/2017 FINAL  Final  Surgical pcr screen     Status: None   Collection Time: 11/19/17  1:05 PM  Result Value Ref Range Status   MRSA, PCR NEGATIVE NEGATIVE Final   Staphylococcus aureus NEGATIVE NEGATIVE Final    Comment: (NOTE) The Xpert SA Assay (FDA approved for NASAL specimens in patients 8 years of age and older), is one component of a comprehensive surveillance program. It is not intended to diagnose infection nor to guide or monitor treatment.      Labs: BNP (last 3 results) No results for input(s): BNP in the last 8760 hours. Basic Metabolic Panel: Recent Labs  Lab 11/18/17 0405 11/19/17 1432 11/21/17 0546 11/21/17 0900 11/22/17 0455 11/23/17 0702  NA 140 139  --  135 133* 129*  K 3.9 3.7  --  4.0 4.4 4.1  CL 105 105  --  99* 97* 92*  CO2 24 22  --  20* 25 21*  GLUCOSE 57* 110*  --  123* 79 115*  BUN 29* 51*  --  44* 23* 47*  CREATININE 5.10* 6.80*  6.02* 6.06* 3.99* 6.49*  CALCIUM 7.4* 6.8*  --  6.7* 7.7* 7.4*  PHOS  --  6.1*  --  6.3*  --  5.8*   Liver Function Tests: Recent Labs  Lab 11/19/17 1432 11/21/17 0900 11/23/17 0702  ALBUMIN 2.9* 2.8* 2.9*   No results for input(s): LIPASE, AMYLASE in the last 168 hours. No results for input(s): AMMONIA in the last 168 hours. CBC: Recent Labs  Lab 11/17/17 0243 11/18/17 0405 11/19/17 1432 11/21/17 0900 11/23/17 0702  WBC 8.2 9.5 9.1 11.4* 8.4  HGB 9.8* 10.8*  9.8* 9.5* 9.6*  HCT 29.4* 33.4* 30.0* 28.6* 28.7*  MCV 84.7 87.4 86.0 85.9 85.2  PLT 255 284 274 261 247   Cardiac Enzymes: No results for input(s): CKTOTAL, CKMB, CKMBINDEX, TROPONINI in the last 168 hours. BNP: Invalid input(s): POCBNP CBG: Recent Labs  Lab 11/22/17 0753 11/22/17 1158 11/22/17 1704 11/22/17 2243 11/23/17 0755  GLUCAP 72 116* 162* 185* 122*   D-Dimer No results for input(s): DDIMER in the last 72 hours. Hgb A1c No results for input(s): HGBA1C in the last 72 hours. Lipid Profile No results for input(s): CHOL, HDL, LDLCALC, TRIG, CHOLHDL, LDLDIRECT in the last 72 hours. Thyroid function studies No results for input(s): TSH, T4TOTAL, T3FREE, THYROIDAB in the last 72 hours.  Invalid input(s): FREET3 Anemia work up No results for input(s): VITAMINB12, FOLATE, FERRITIN, TIBC, IRON, RETICCTPCT in the last 72 hours. Urinalysis    Component Value Date/Time   COLORURINE STRAW (A) 11/16/2017 0301   APPEARANCEUR CLEAR 11/16/2017 0301   LABSPEC 1.004 (L) 11/16/2017 0301   PHURINE 8.0 11/16/2017 0301   GLUCOSEU 50 (A) 11/16/2017 0301   HGBUR SMALL (A) 11/16/2017 0301   BILIRUBINUR NEGATIVE 11/16/2017 0301   KETONESUR 5 (A) 11/16/2017 0301   PROTEINUR 100 (A) 11/16/2017 0301   UROBILINOGEN 0.2 10/18/2011 1003   NITRITE NEGATIVE 11/16/2017 0301   LEUKOCYTESUR MODERATE (A) 11/16/2017 0301   Sepsis Labs Invalid input(s): PROCALCITONIN,  WBC,  LACTICIDVEN Microbiology Recent Results (from the past 240 hour(s))  Culture, blood (routine x 2)     Status: None   Collection Time: 11/14/17  5:53 PM  Result Value Ref Range Status   Specimen Description BLOOD LEFT HAND  Final   Special Requests   Final    Blood Culture results may not be optimal due to an inadequate volume of blood received in culture bottles BOTTLES DRAWN AEROBIC AND ANAEROBIC   Culture NO GROWTH 5 DAYS  Final   Report Status 11/19/2017 FINAL  Final  Culture, blood (routine x 2)     Status: None    Collection Time: 11/14/17  6:37 PM  Result Value Ref Range Status   Specimen Description BLOOD RIGHT ANTECUBITAL  Final   Special Requests   Final    Blood Culture adequate volume BOTTLES DRAWN AEROBIC AND ANAEROBIC   Culture NO GROWTH 5 DAYS  Final   Report Status 11/19/2017 FINAL  Final  Urine culture     Status: Abnormal   Collection Time: 11/16/17  3:02 AM  Result Value Ref Range Status   Specimen Description URINE, RANDOM  Final   Special Requests NONE  Final   Culture MULTIPLE SPECIES PRESENT, SUGGEST RECOLLECTION (A)  Final   Report Status 11/17/2017 FINAL  Final  Surgical pcr screen     Status: None   Collection Time: 11/19/17  1:05 PM  Result Value Ref Range Status   MRSA, PCR NEGATIVE NEGATIVE Final   Staphylococcus aureus NEGATIVE  NEGATIVE Final    Comment: (NOTE) The Xpert SA Assay (FDA approved for NASAL specimens in patients 25 years of age and older), is one component of a comprehensive surveillance program. It is not intended to diagnose infection nor to guide or monitor treatment.      Time coordinating discharge: 45 minutes  SIGNED:   Tawni Millers, MD  [facility] 11/23/2017, 10:36 AM Pager [phone]  If 7PM-7AM, please contact night-coverage [email or web address] Password [email or web address]

## 2017-11-23 NOTE — Progress Notes (Signed)
Assessment/Plan: 1. ESRD, new start, planning to go back to [place] for dialysis at discharge 2. PNA - improving. 3. 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 HTN/vol- BP better- on metoprolol-coming down with HD  7. Bones- phos 6.3- startrf TUMS with meals- told her she can get as OP without prescription  Plan HD today- 5th treatment- pre labs ,  VVS placed AVG 12/4 .          Patient returning to [place] on 12/9 via airplane.  They have arranged f/u dialysis there and have been in contact with a nephrologist.  she is OK for discharge after HD today.  She then tells me she wont see MD til next Tuesday  - not ideal but is what we have   Subjective: Interval History: Seen on HD- BP lowish- no c/o's   Objective: Vital signs in last 24 hours: Temp:  [98 F (36.7 C)-98.9 F (37.2 C)] 98 F (36.7 C) (12/07 0805) Pulse Rate:  [60-67] 60 (12/07 0816) Resp:  [17-18] 17 (12/07 0816) BP: (124-142)/(52-70) 138/67 (12/07 0816) SpO2:  [95 %-98 %] 96 % (12/07 0805) Weight:  [54.3 kg (119 lb 11.4 oz)] 54.3 kg (119 lb 11.4 oz) (12/07 0805) Weight change:   Intake/Output from previous day: 12/06 0701 - 12/07 0700 In: 486 [P.O.:480; I.V.:6] Out: -  Intake/Output this shift: Total I/O In: -  Out: 150 [Urine:150]  General appearance: alert and cooperative Back: negative, symmetric, no curvature. ROM normal. No CVA tenderness. Resp: clear to auscultation bilaterally Chest wall: no tenderness Cardio: regular rate and rhythm, S1, S2 normal, no murmur, click, rub or gallop Extremities: extremities normal, atraumatic, no cyanosis or edema- new left upper arm AVG wth bruit.  Right sided PC   Lab Results: Recent Labs    11/21/17 0900 11/23/17 0702  WBC 11.4* 8.4  HGB 9.5* 9.6*  HCT 28.6* 28.7*  PLT 261 247   BMET:  Recent Labs    11/22/17 0455 11/23/17 0702  NA 133* 129*   K 4.4 4.1  CL 97* 92*  CO2 25 21*  GLUCOSE 79 115*  BUN 23* 47*  CREATININE 3.99* 6.49*  CALCIUM 7.7* 7.4*   No results for input(s): PTH in the last 72 hours. Iron Studies: No results for input(s): IRON, TIBC, TRANSFERRIN, FERRITIN in the last 72 hours. Studies/Results: No results found.  Scheduled: . atorvastatin  40 mg Oral QPM  . calcium carbonate  200 mg of elemental calcium Oral TID WC  . darbepoetin (ARANESP) injection - DIALYSIS  100 mcg Intravenous Q Fri-HD  . enoxaparin (LOVENOX) injection  30 mg Subcutaneous QHS  . fluticasone  1 spray Each Nare Daily  . insulin aspart  0-9 Units Subcutaneous TID WC  . metoprolol tartrate  25 mg Oral BID  . sodium chloride flush  3 mL Intravenous Q12H     LOS: 9 days   Alayna Mabe A 11/23/2017,8:53 AM

## 2017-11-27 ENCOUNTER — Inpatient Hospital Stay (INDEPENDENT_AMBULATORY_CARE_PROVIDER_SITE_OTHER): Payer: Self-pay | Admitting: Physician Assistant

## 2017-11-28 ENCOUNTER — Telehealth: Payer: Self-pay | Admitting: Internal Medicine

## 2017-11-28 NOTE — Telephone Encounter (Signed)
Hassan Rowan (patient's daughter) states patient moved to [place] and daughter will like to speak to Dr. Amil Amen about patient. Please call [phone]

## 2017-11-29 ENCOUNTER — Ambulatory Visit: Payer: Self-pay | Admitting: Internal Medicine

## 2017-11-29 NOTE — Telephone Encounter (Signed)
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   Discussed this was tested here--4th generation -- and negative on 11/15/17.  She did receive blood transfusion x 2 per Hassan Rowan while in hospital here.   Not clear what sort of institution she is receiving care.  Hassan Rowan to obtain lab results and bring them in.  Will need to find out what sort of institution [facility] is receiving care.

## 2018-03-27 IMAGING — DX DG CHEST 2V
2 series · 2 of 2 positions shown · non-contrast
Comparison: None.

CLINICAL DATA: 2 day hx. Sob, chest upper right chest pain
radiating through to right upper posterior chest. Bilat. Arm pain.
Hx DM, HTN

EXAM:
CHEST  2 VIEW

[chest pa]
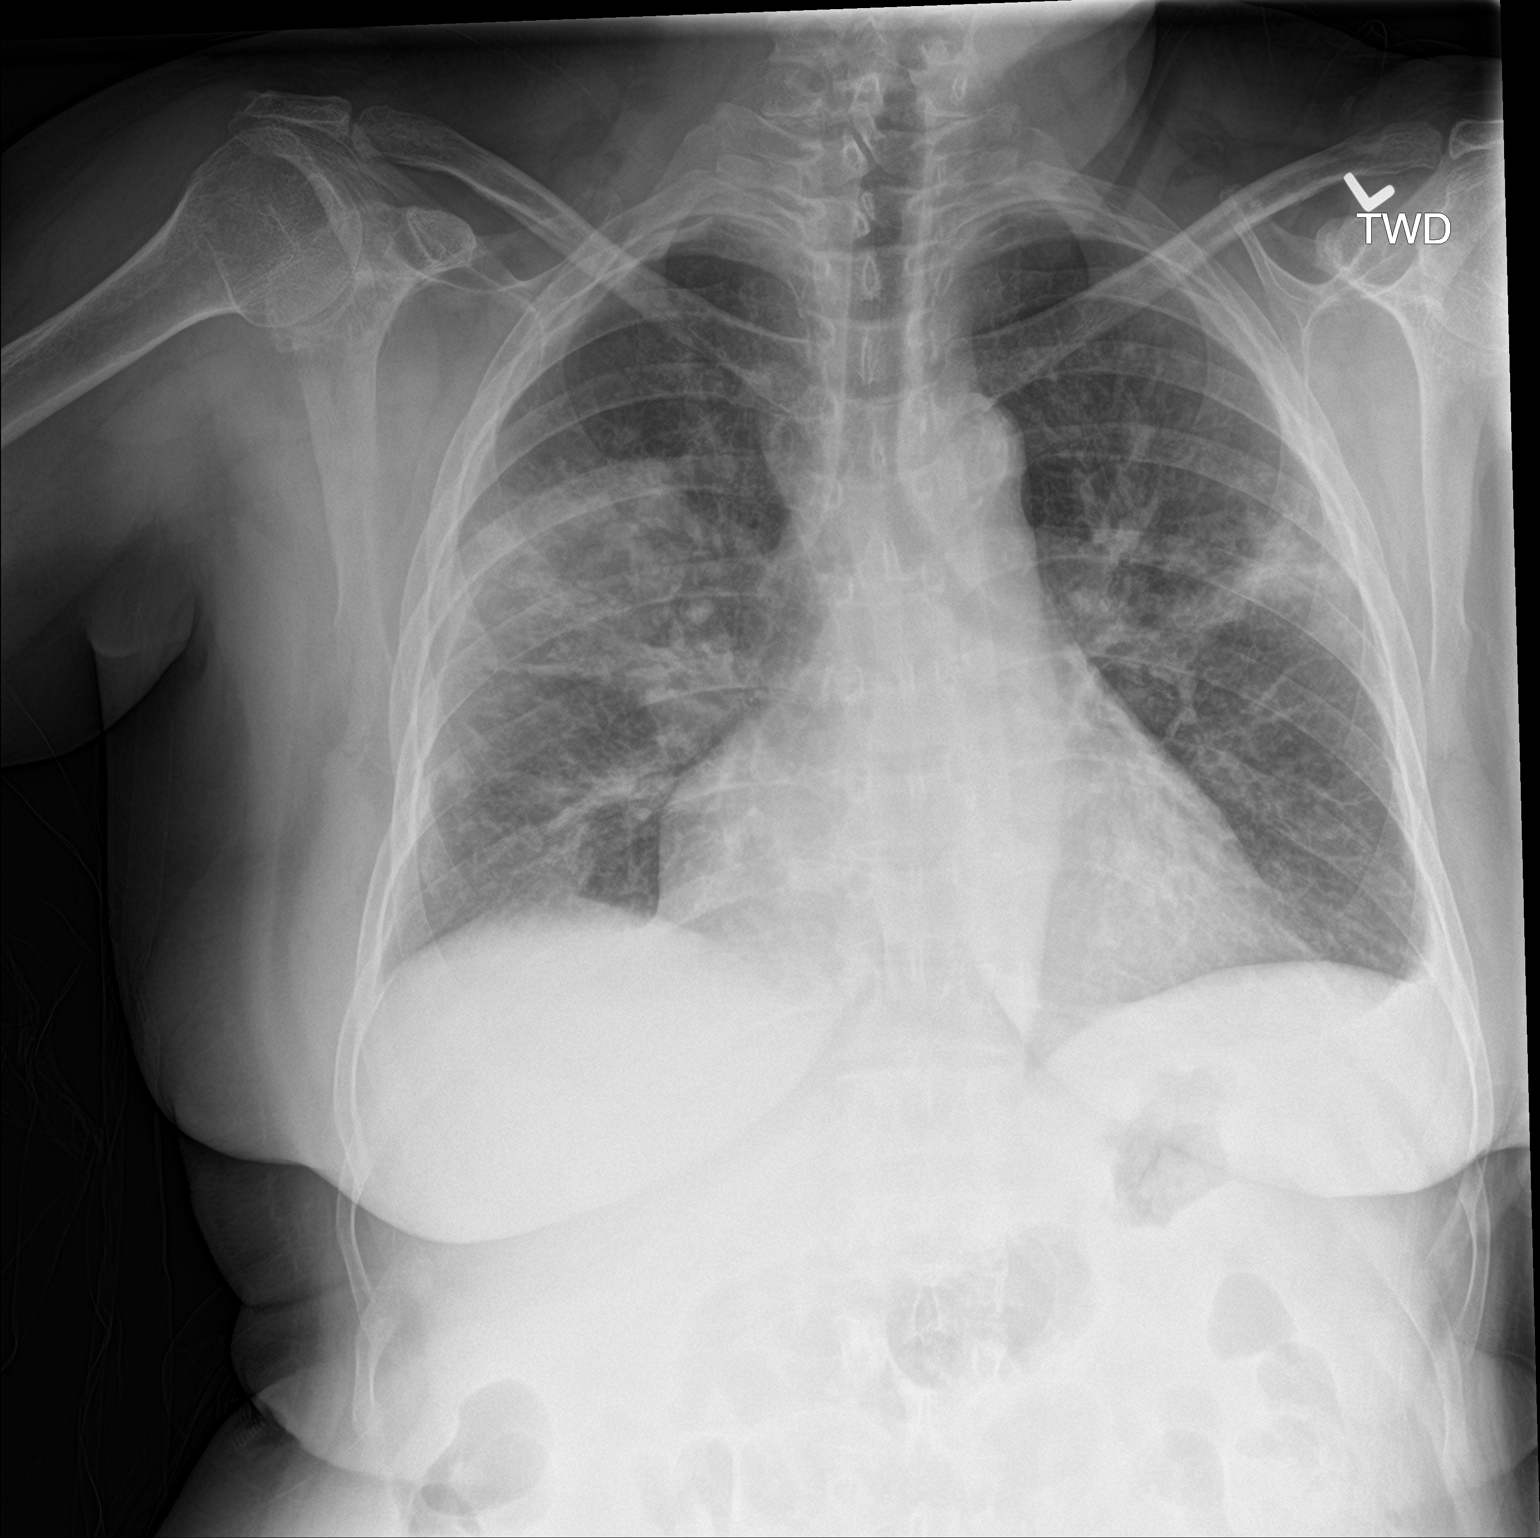

[chest lat]
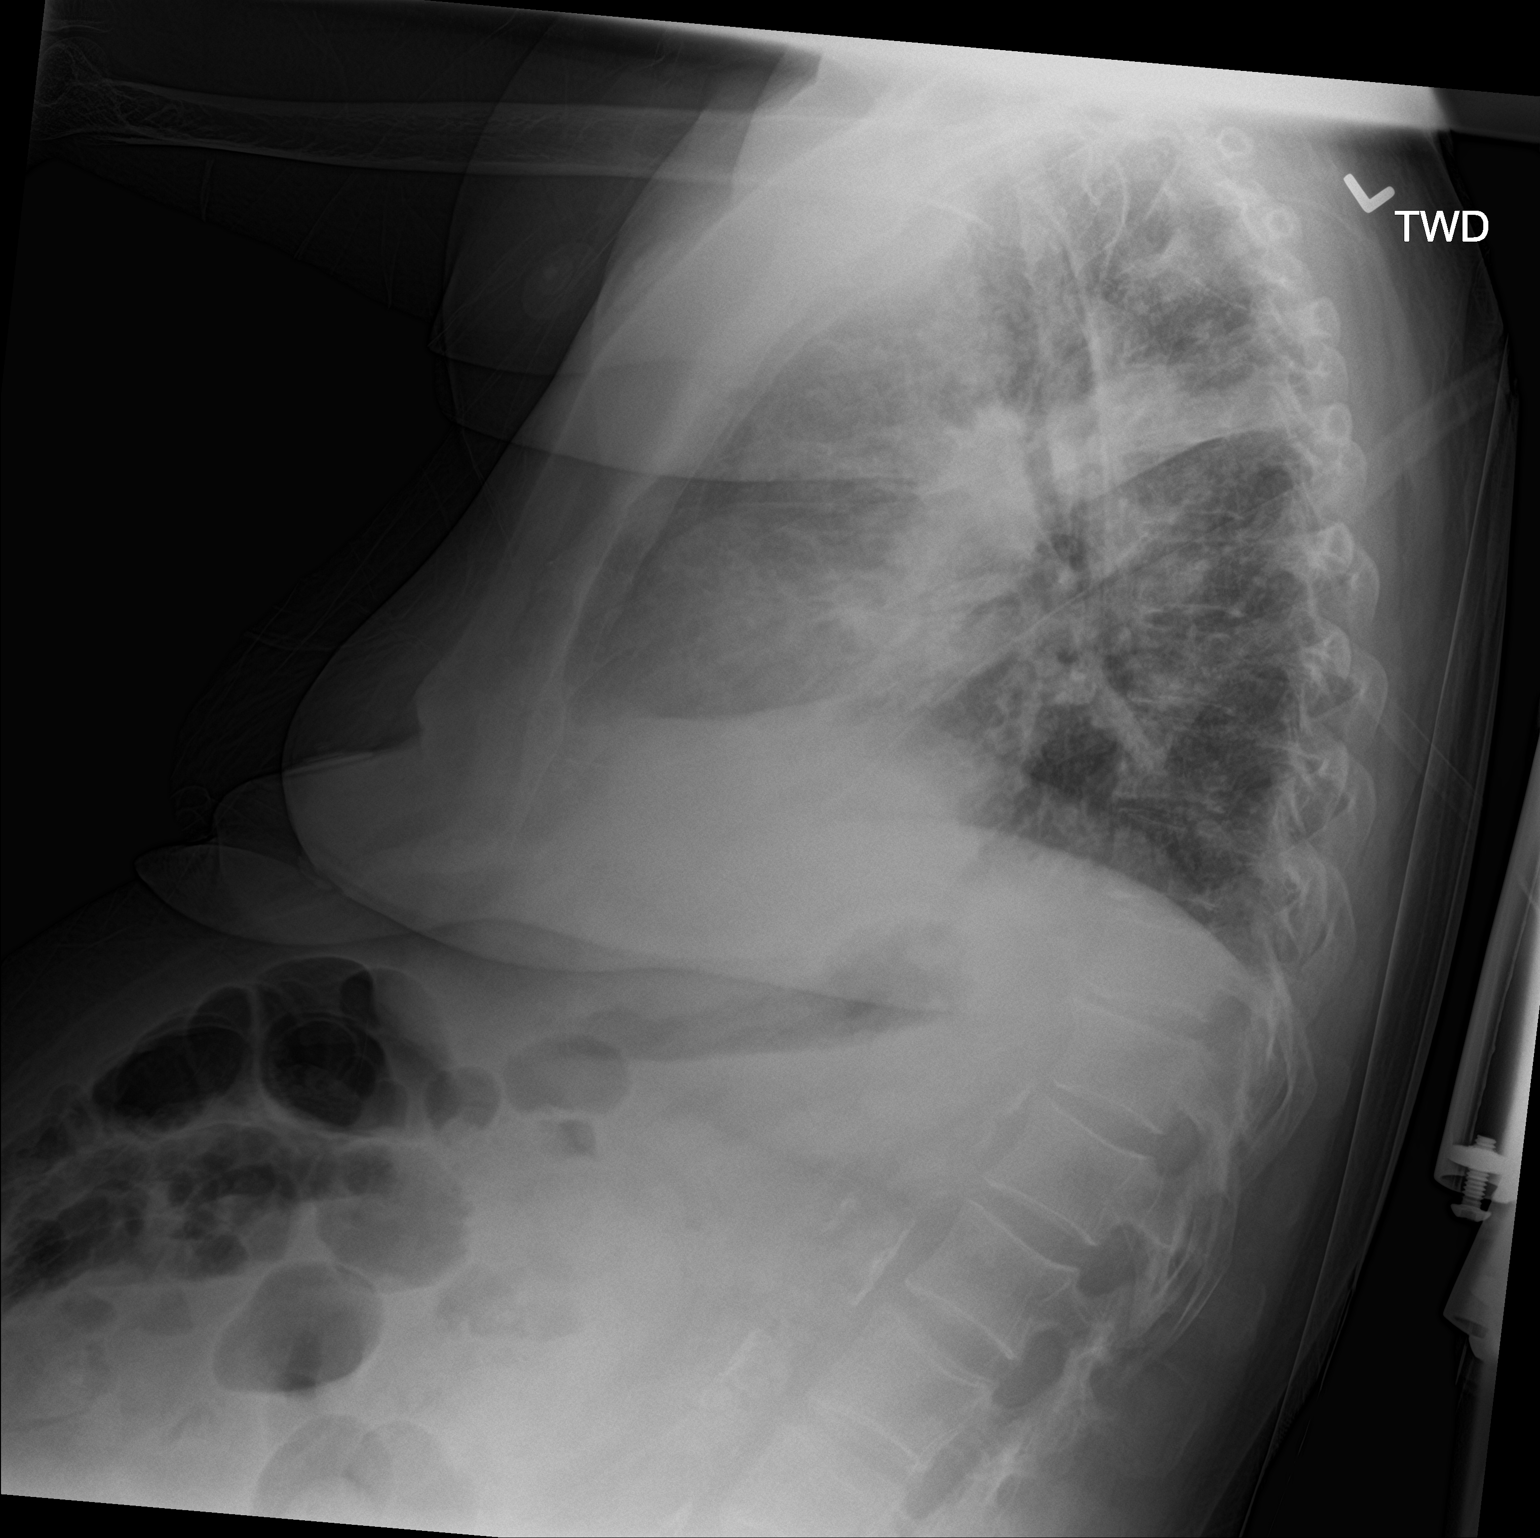

[2 of 2 positions shown; findings below may reference images not displayed]

FINDINGS: Heart is mildly enlarged. No pulmonary edema. There patchy airspace
filling opacities within the right upper lobe, right lower lobe, and
left upper lobe. There pleural changes bilaterally, consistent small
effusions or thickening. Degenerative changes are seen in the
shoulders bilaterally. There is atherosclerotic calcification of the
abdominal aorta.
IMPRESSION: 1. Multifocal pulmonary infiltrates.
2. Cardiomegaly without pulmonary edema.
